# Patient Record
Sex: Male | Born: 1950 | Race: White | Hispanic: No | Marital: Married | Smoking: Never smoker
Health system: Southern US, Community
[De-identification: ages and names within clinical notes are randomized; demographics above are authoritative.]

## PROBLEM LIST (undated history)

## (undated) DIAGNOSIS — E785 Hyperlipidemia, unspecified: Secondary | ICD-10-CM

## (undated) DIAGNOSIS — I4891 Unspecified atrial fibrillation: Secondary | ICD-10-CM

## (undated) DIAGNOSIS — I10 Essential (primary) hypertension: Secondary | ICD-10-CM

## (undated) HISTORY — DX: Unspecified atrial fibrillation: I48.91

## (undated) HISTORY — DX: Hyperlipidemia, unspecified: E78.5

## (undated) HISTORY — PX: OTHER SURGICAL HISTORY: SHX169

## (undated) HISTORY — DX: Essential (primary) hypertension: I10

---

## 2001-10-03 ENCOUNTER — Observation Stay (HOSPITAL_COMMUNITY): Admission: RE | Admit: 2001-10-03 | Discharge: 2001-10-04 | Payer: Self-pay | Admitting: Specialist

## 2003-05-10 HISTORY — PX: TOTAL KNEE ARTHROPLASTY: SHX125

## 2003-09-23 ENCOUNTER — Ambulatory Visit (HOSPITAL_COMMUNITY): Admission: RE | Admit: 2003-09-23 | Discharge: 2003-09-23 | Payer: Self-pay | Admitting: Specialist

## 2004-01-02 ENCOUNTER — Inpatient Hospital Stay (HOSPITAL_COMMUNITY): Admission: RE | Admit: 2004-01-02 | Discharge: 2004-01-05 | Payer: Self-pay | Admitting: Specialist

## 2006-06-14 ENCOUNTER — Ambulatory Visit: Payer: Self-pay | Admitting: Internal Medicine

## 2006-06-14 LAB — CONVERTED CEMR LAB
Basophils Absolute: 0 10*3/uL (ref 0.0–0.1)
Calcium: 9.5 mg/dL (ref 8.4–10.5)
Chloride: 108 meq/L (ref 96–112)
Eosinophils Absolute: 0.2 10*3/uL (ref 0.0–0.6)
Eosinophils Relative: 2.7 % (ref 0.0–5.0)
GFR calc Af Amer: 63 mL/min
GFR calc non Af Amer: 52 mL/min
Glucose, Bld: 99 mg/dL (ref 70–99)
Lymphocytes Relative: 21.6 % (ref 12.0–46.0)
MCHC: 34.6 g/dL (ref 30.0–36.0)
MCV: 92 fL (ref 78.0–100.0)
Monocytes Relative: 9.1 % (ref 3.0–11.0)
Neutro Abs: 5.7 10*3/uL (ref 1.4–7.7)
Platelets: 199 10*3/uL (ref 150–400)
RBC: 4.72 M/uL (ref 4.22–5.81)
WBC: 8.6 10*3/uL (ref 4.5–10.5)
aPTT: 28.8 s (ref 26.5–36.5)

## 2006-06-21 ENCOUNTER — Ambulatory Visit: Payer: Self-pay | Admitting: Internal Medicine

## 2006-06-21 ENCOUNTER — Inpatient Hospital Stay (HOSPITAL_BASED_OUTPATIENT_CLINIC_OR_DEPARTMENT_OTHER): Admission: RE | Admit: 2006-06-21 | Discharge: 2006-06-21 | Payer: Self-pay | Admitting: Internal Medicine

## 2006-07-03 ENCOUNTER — Ambulatory Visit: Payer: Self-pay | Admitting: Cardiology

## 2007-04-11 ENCOUNTER — Encounter (INDEPENDENT_AMBULATORY_CARE_PROVIDER_SITE_OTHER): Payer: Self-pay | Admitting: *Deleted

## 2008-02-29 ENCOUNTER — Ambulatory Visit (HOSPITAL_BASED_OUTPATIENT_CLINIC_OR_DEPARTMENT_OTHER): Admission: RE | Admit: 2008-02-29 | Discharge: 2008-02-29 | Payer: Self-pay | Admitting: Orthopedic Surgery

## 2008-06-05 ENCOUNTER — Ambulatory Visit (HOSPITAL_BASED_OUTPATIENT_CLINIC_OR_DEPARTMENT_OTHER): Admission: RE | Admit: 2008-06-05 | Discharge: 2008-06-05 | Payer: Self-pay | Admitting: Orthopedic Surgery

## 2009-04-07 ENCOUNTER — Ambulatory Visit: Payer: Self-pay | Admitting: Family Medicine

## 2009-04-07 DIAGNOSIS — I10 Essential (primary) hypertension: Secondary | ICD-10-CM

## 2009-04-07 DIAGNOSIS — R21 Rash and other nonspecific skin eruption: Secondary | ICD-10-CM

## 2009-04-07 DIAGNOSIS — E785 Hyperlipidemia, unspecified: Secondary | ICD-10-CM

## 2009-04-21 ENCOUNTER — Ambulatory Visit: Payer: Self-pay | Admitting: Family Medicine

## 2009-04-21 ENCOUNTER — Encounter (INDEPENDENT_AMBULATORY_CARE_PROVIDER_SITE_OTHER): Payer: Self-pay | Admitting: *Deleted

## 2009-04-21 LAB — CONVERTED CEMR LAB
Bilirubin Urine: NEGATIVE
Nitrite: NEGATIVE
Specific Gravity, Urine: 1.02
Urobilinogen, UA: 0.2

## 2009-04-22 LAB — CONVERTED CEMR LAB
ALT: 19 units/L (ref 0–53)
AST: 24 units/L (ref 0–37)
Albumin: 4.2 g/dL (ref 3.5–5.2)
Alkaline Phosphatase: 24 units/L — ABNORMAL LOW (ref 39–117)
BUN: 29 mg/dL — ABNORMAL HIGH (ref 6–23)
Basophils Absolute: 0 10*3/uL (ref 0.0–0.1)
Basophils Relative: 0.4 % (ref 0.0–3.0)
Bilirubin, Direct: 0.2 mg/dL (ref 0.0–0.3)
CO2: 27 meq/L (ref 19–32)
Calcium: 9.1 mg/dL (ref 8.4–10.5)
Chloride: 106 meq/L (ref 96–112)
Cholesterol: 179 mg/dL (ref 0–200)
Creatinine, Ser: 1.8 mg/dL — ABNORMAL HIGH (ref 0.4–1.5)
Direct LDL: 106.5 mg/dL
Eosinophils Absolute: 0.2 10*3/uL (ref 0.0–0.7)
Eosinophils Relative: 2.8 % (ref 0.0–5.0)
GFR calc non Af Amer: 41.43 mL/min (ref >60)
Glucose, Bld: 94 mg/dL (ref 70–99)
HCT: 40.8 % (ref 39.0–52.0)
HDL: 45.1 mg/dL (ref >39.00)
Hemoglobin: 13.8 g/dL (ref 13.0–17.0)
Lymphocytes Relative: 24.6 % (ref 12.0–46.0)
Lymphs Abs: 2 10*3/uL (ref 0.7–4.0)
MCHC: 33.8 g/dL (ref 30.0–36.0)
MCV: 96.2 fL (ref 78.0–100.0)
Monocytes Absolute: 0.8 10*3/uL (ref 0.1–1.0)
Monocytes Relative: 10.5 % (ref 3.0–12.0)
Neutro Abs: 5 10*3/uL (ref 1.4–7.7)
Neutrophils Relative %: 61.7 % (ref 43.0–77.0)
PSA: 0.68 ng/mL (ref 0.10–4.00)
Platelets: 153 10*3/uL (ref 150.0–400.0)
Potassium: 4.1 meq/L (ref 3.5–5.1)
RBC: 4.24 M/uL (ref 4.22–5.81)
RDW: 12.7 % (ref 11.5–14.6)
Sodium: 141 meq/L (ref 135–145)
TSH: 1.23 microintl units/mL (ref 0.35–5.50)
Total Bilirubin: 1.1 mg/dL (ref 0.3–1.2)
Total CHOL/HDL Ratio: 4
Total Protein: 6.7 g/dL (ref 6.0–8.3)
Triglycerides: 235 mg/dL — ABNORMAL HIGH (ref 0.0–149.0)
VLDL: 47 mg/dL — ABNORMAL HIGH (ref 0.0–40.0)
WBC: 8 10*3/uL (ref 4.5–10.5)

## 2009-05-15 ENCOUNTER — Telehealth: Payer: Self-pay | Admitting: Family Medicine

## 2009-05-18 ENCOUNTER — Telehealth: Payer: Self-pay | Admitting: Family Medicine

## 2009-12-14 ENCOUNTER — Encounter: Payer: Self-pay | Admitting: Family Medicine

## 2010-02-24 ENCOUNTER — Ambulatory Visit: Payer: Self-pay | Admitting: Family Medicine

## 2010-02-25 LAB — CONVERTED CEMR LAB
BUN: 29 mg/dL — ABNORMAL HIGH (ref 6–23)
Bilirubin, Direct: 0.2 mg/dL (ref 0.0–0.3)
Chloride: 109 meq/L (ref 96–112)
Cholesterol: 177 mg/dL (ref 0–200)
LDL Cholesterol: 95 mg/dL (ref 0–99)
Potassium: 4.3 meq/L (ref 3.5–5.1)
Total Protein: 6.8 g/dL (ref 6.0–8.3)
VLDL: 37.6 mg/dL (ref 0.0–40.0)

## 2010-03-02 ENCOUNTER — Encounter: Admission: RE | Admit: 2010-03-02 | Discharge: 2010-03-02 | Payer: Self-pay | Admitting: General Surgery

## 2010-03-04 ENCOUNTER — Telehealth: Payer: Self-pay | Admitting: Family Medicine

## 2010-03-04 ENCOUNTER — Ambulatory Visit: Payer: Self-pay | Admitting: Family Medicine

## 2010-03-04 DIAGNOSIS — I4891 Unspecified atrial fibrillation: Secondary | ICD-10-CM

## 2010-03-05 ENCOUNTER — Encounter: Payer: Self-pay | Admitting: Family Medicine

## 2010-03-24 ENCOUNTER — Encounter: Payer: Self-pay | Admitting: Family Medicine

## 2010-06-10 NOTE — Progress Notes (Signed)
Summary: Hernia surg/A-Fib  Phone Note From Other Clinic   Summary of Call: Call from anesthesiologist.  pt with elective hernia repair.  Preop EKG unremarkable.  Following surgery A fib with rate 90s to 100.  Pt stable with no c/o and no chest pain.  We will work in to be seen this afternoon or tomorrow.  Call [phone] (recovery) to schedule time with pt. Initial call taken by: Evelena Peat MD,  March 04, 2010 9:42 AM  Follow-up for Phone Call        Pt scheduled for this afternoon 3:30 Follow-up by: Sid Falcon LPN,  March 04, 2010 9:55 AM

## 2010-06-10 NOTE — Letter (Signed)
Summary: [facility]   Imported By: Maryln Gottron 04/14/2010 09:08:29  _____________________________________________________________________  External Attachment:    Type:   Image     Comment:   External Document

## 2010-06-10 NOTE — Assessment & Plan Note (Signed)
Summary: A-Fib follow-up/nn   Vital Signs:  Patient profile:   60 year old male Temp:     98.2 degrees F oral Pulse rate:   72 / minute Pulse rhythm:   regular Resp:     12 per minute BP sitting:   100 / 60  (left arm) Cuff size:   large  Vitals Entered By: Sid Falcon LPN (March 04, 2010 3:32 PM)  History of Present Illness: Patient seen as a work-in with reported atrial fibrillation this morning after he came out of surgery for elective hernia repair. I received call from anesthesiologist that he was in atrial fibrillation with rate around 90. Patient has history of previous transient atrial fibrillation with prior surgical procedure. No recent atrial fibrillation prior to this morning. Denies any chest pains or dyspnea. Recent thyroid functions normal. Remains on several antihypertensives as listed. Blood pressures have been well controlled.  No signif ETOH use.    Allergies (verified): No Known Drug Allergies  Past History:  Social History: Last updated: 04/07/2009 Occupation:  Electronics engineer Co Married Never Smoked Alcohol use-yes Regular exercise-yes  Past Medical History: Hyperlipidemia Hypertension Hx transient Atrial Fibrillation PMH reviewed for relevance, SH/Risk Factors reviewed for relevance  Review of Systems  The patient denies fever, chest pain, syncope, dyspnea on exertion, peripheral edema, prolonged cough, hemoptysis, and abdominal pain.    Physical Exam  General:  Well-developed,well-nourished,in no acute distress; alert,appropriate and cooperative throughout examination Neck:  No deformities, masses, or tenderness noted. Lungs:  Normal respiratory effort, chest expands symmetrically. Lungs are clear to auscultation, no crackles or wheezes. Heart:  Normal rate and regular rhythm. S1 and S2 normal without gallop, murmur, click, rub or other extra sounds. Extremities:  no edema. Neurologic:  alert & oriented X3 and cranial  nerves II-XII intact.   Psych:  normally interactive, good eye contact, not anxious appearing, and not depressed appearing.     Impression & Recommendations:  Problem # 1:  ATRIAL FIBRILLATION (ICD-427.31) Assessment New  now resolved. Patient has sinus rhythm with occasional PACs.  EKG strips post op reviewed and confirm a fib.  Observe for now. Has had prior holter monitor several years ago. His updated medication list for this problem includes:    Verapamil Hcl Cr 240 Mg Xr24h-cap (Verapamil hcl) ..... Once daily    Aspirin 81 Mg Tabs (Aspirin) ..... Once daily  Orders: EKG w/ Interpretation (93000)  Complete Medication List: 1)  Vytorin 10-40 Mg Tabs (Ezetimibe-simvastatin) .... Once daily 2)  Lisinopril-hydrochlorothiazide 20-12.5 Mg Tabs (Lisinopril-hydrochlorothiazide) .... Once daily 3)  Tricor 145 Mg Tabs (Fenofibrate) .... Once daily 4)  Cozaar 100 Mg Tabs (Losartan potassium) .... Once daily 5)  Verapamil Hcl Cr 240 Mg Xr24h-cap (Verapamil hcl) .... Once daily 6)  Aspirin 81 Mg Tabs (Aspirin) .... Once daily 7)  Triamcinolone Acetonide 0.1 % Crea (Triamcinolone acetonide) .... Compound: triam = 1 part cetaphil lotion = 2 parts apply to area two times a day   Orders Added: 1)  EKG w/ Interpretation [93000] 2)  Est. Patient Level IV [16109]

## 2010-06-10 NOTE — Assessment & Plan Note (Signed)
Summary: med check/cjr   Vital Signs:  Patient profile:   60 year old male Weight:      266 pounds Temp:     98.4 degrees F BP sitting:   120 / 72  (left arm) Cuff size:   large  Vitals Entered By: Sid Falcon LPN (February 24, 2010 8:46 AM)  History of Present Illness: Patient for followup multiple medical problems. History of hyperlipidemia, hypertension, and probable chronic kidney disease. Creatinine 1.8 last year and in looking back over labs 3  years ago 1.5. Current medications are reviewed. Compliant with all. No side effects. Declines flu vaccine.  Hypertension History:      He denies headache, chest pain, palpitations, dyspnea with exertion, orthopnea, PND, peripheral edema, visual symptoms, neurologic problems, syncope, and side effects from treatment.  He notes no problems with any antihypertensive medication side effects.        Positive major cardiovascular risk factors include male age 59 years old or older, hyperlipidemia, and hypertension.  Negative major cardiovascular risk factors include no history of diabetes, negative family history for ischemic heart disease, and non-tobacco-user status.        Further assessment for target organ damage reveals no history of ASHD, stroke/TIA, or peripheral vascular disease.    Lipid Management History:      Positive NCEP/ATP III risk factors include male age 59 years old or older and hypertension.  Negative NCEP/ATP III risk factors include non-diabetic, no family history for ischemic heart disease, non-tobacco-user status, no ASHD (atherosclerotic heart disease), no prior stroke/TIA, no peripheral vascular disease, and no history of aortic aneurysm.      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  Teeth in good repair. Neck:  No deformities, masses, or tenderness noted. Lungs:  Normal respiratory effort, chest expands symmetrically. Lungs are clear to auscultation, no crackles or wheezes. Heart:  Normal rate and regular rhythm. S1 and S2 normal without gallop, murmur, click, rub or other extra sounds. Extremities:  significant varicosities otherwise no edema Neurologic:  alert & oriented X3, cranial nerves II-XII intact, and gait normal.   Psych:  normally interactive, good eye contact, not anxious appearing, and not depressed appearing.     Impression & Recommendations:  Problem # 1:  HYPERTENSION (ICD-401.9)  His updated medication list for this problem includes:    Lisinopril-hydrochlorothiazide 20-12.5 Mg Tabs (Lisinopril-hydrochlorothiazide) ..... Once daily    Cozaar 100 Mg Tabs (Losartan potassium) ..... Once daily    Verapamil Hcl Cr 240 Mg Xr24h-cap (Verapamil hcl) ..... Once daily  Orders: TLB-BMP (Basic Metabolic Panel-BMET) (80048-METABOL)  Problem # 2:  HYPERLIPIDEMIA (ICD-272.4)  His updated medication list for this problem includes:    Vytorin 10-40 Mg Tabs (Ezetimibe-simvastatin) ..... Once daily    Tricor  145 Mg Tabs (Fenofibrate) ..... Once daily  Orders: TLB-Lipid Panel (80061-LIPID) TLB-Hepatic/Liver  Function Pnl (80076-HEPATIC)  Complete Medication List: 1)  Vytorin 10-40 Mg Tabs (Ezetimibe-simvastatin) .... Once daily 2)  Lisinopril-hydrochlorothiazide 20-12.5 Mg Tabs (Lisinopril-hydrochlorothiazide) .... Once daily 3)  Tricor 145 Mg Tabs (Fenofibrate) .... Once daily 4)  Cozaar 100 Mg Tabs (Losartan potassium) .... Once daily 5)  Verapamil Hcl Cr 240 Mg Xr24h-cap (Verapamil hcl) .... Once daily 6)  Aspirin 81 Mg Tabs (Aspirin) .... Once daily 7)  Triamcinolone Acetonide 0.1 % Crea (Triamcinolone acetonide) .... Compound: triam = 1 part cetaphil lotion = 2 parts apply to area two times a day  Other Orders: Tdap => 58yrs IM (16109) Admin 1st Vaccine (60454)  Hypertension Assessment/Plan:      The patient's hypertensive risk group is category B: At least one risk factor (excluding diabetes) with no target organ damage.  Today's blood pressure is 120/72.    Lipid Assessment/Plan:      Based on NCEP/ATP III, the patient's risk factor category is "2 or more risk factors and a calculated 10 year CAD risk of > 20%".  The patient's lipid goals are as follows: Total cholesterol goal is 200; LDL cholesterol goal is 130; HDL cholesterol goal is 40; Triglyceride goal is 150.    Patient Instructions: 1)  Please schedule a follow-up appointment in 6 months .  Prescriptions: VERAPAMIL HCL CR 240 MG XR24H-CAP (VERAPAMIL HCL) once daily  #90 x 3   Entered and Authorized by:   Evelena Peat MD   Signed by:   Evelena Peat MD on 02/24/2010   Method used:   Electronically to        [facility]. [phone]* (retail)       [address]       [place], [place]  [address]       Ph: [phone]       Fax: [phone]   RxID:   [id number] COZAAR 100 MG TABS (LOSARTAN POTASSIUM) once daily  #90 Tablet x 3   Entered and Authorized by:   Evelena Peat MD   Signed by:   Evelena Peat MD on 02/24/2010   Method used:   Electronically to        [facility]. [phone]* (retail)       [address]       [place], [place]  [address]       Ph: [phone]       Fax: [phone]   RxID:   [id number] TRICOR 145 MG TABS (FENOFIBRATE) once daily  #90 x 3   Entered and Authorized by:   Evelena Peat MD   Signed by:   Evelena Peat MD on 02/24/2010   Method used:   Electronically to        [facility]. [phone]* (retail)       [address]       [place], [place]  [address]       Ph: [phone]       Fax: [phone]   RxID:   [id number] LISINOPRIL-HYDROCHLOROTHIAZIDE 20-12.5 MG TABS (LISINOPRIL-HYDROCHLOROTHIAZIDE) once daily  #90 Tablet x 3   Entered and Authorized by:   Evelena Peat MD   Signed by:   Evelena Peat MD on 02/24/2010   Method used:   Electronically to  [facility]. [phone]* (retail)       [address]       [place], [place]  [address]       Ph: [phone]       Fax: [phone]   RxID:   [id number] VYTORIN 10-40 MG TABS (EZETIMIBE-SIMVASTATIN) once daily  #90 x 3   Entered and Authorized by:   Evelena Peat MD   Signed by:   Evelena Peat MD on 02/24/2010   Method used:   Electronically to        [facility]. [phone]* (retail)       [address]       [place], [place]  [address]       Ph: [phone]       Fax: [phone]   RxID:   [id number]    Orders Added: 1)  TLB-Lipid Panel [80061-LIPID] 2)  TLB-Hepatic/Liver Function Pnl [80076-HEPATIC] 3)  TLB-BMP (Basic Metabolic Panel-BMET) [80048-METABOL] 4)  Est. Patient Level IV [55732] 5)  Tdap => 20yrs IM [90715] 6)  Admin 1st Vaccine [20254]   Immunizations Administered:  Tetanus Vaccine:    Vaccine Type: Tdap    Site: left deltoid    Mfr: [employer] P[employer]ur    Dose: 0.5 ml    Route: IM    Given by: Sid Falcon LPN    Exp. Date: 02/26/2012    Lot #: [id number]     VIS given: 03/26/08 version given February 24, 2010.   Immunizations Administered:  Tetanus Vaccine:    Vaccine Type: Tdap    Site: left deltoid    Mfr: [employer] P[employer]ur    Dose: 0.5 ml    Route: IM    Given by: Sid Falcon LPN    Exp. Date: 02/26/2012    Lot #: [id number]    VIS given: 03/26/08 version given February 24, 2010.  Appended Document: Orders Update    Clinical Lists Changes  Orders: Added new Service order of Specimen Handling (16073) - Signed

## 2010-06-10 NOTE — Letter (Signed)
Summary: [facility]   Imported By: Maryln Gottron 12/31/2009 15:48:45  _____________________________________________________________________  External Attachment:    Type:   Image     Comment:   External Document

## 2010-06-10 NOTE — Progress Notes (Signed)
Summary: refill Verapamil X 1 year  Phone Note Refill Request Message from:  Fax from Pharmacy  Refills Requested: Medication #1:  VERAPAMIL HCL CR 240 MG XR24H-CAP once daily   Last Refilled: 04/15/2009 [phone]---[phone]   Fax---[phone]  Initial call taken by: Warnell Forester,  May 18, 2009 12:37 PM    Prescriptions: VERAPAMIL HCL CR 240 MG XR24H-CAP (VERAPAMIL HCL) once daily  #90 x 3   Entered by:   Sid Falcon LPN   Authorized by:   Evelena Peat MD   Signed by:   Sid Falcon LPN on 40/98/1191   Method used:   Electronically to        [facility]. [phone]* (retail)       [address]       [place], [place]  [address]       Ph: [phone]       Fax: [phone]   RxID:   [id number]

## 2010-06-10 NOTE — Progress Notes (Signed)
Summary: refill Vytorin, Cozaar, Tricor, Lisinopril X 6 months  Phone Note Refill Request Message from:  Fax from Pharmacy  Refills Requested: Medication #1:  VYTORIN 10-40 MG TABS once daily   Last Refilled: 04/15/2009  Medication #2:  COZAAR 100 MG TABS once daily   Brand Name Necessary? No   Last Refilled: 04/15/2009  Medication #3:  TRICOR 145 MG TABS once daily   Last Refilled: 04/15/2009  Medication #4:  LISINOPRIL-HYDROCHLOROTHIAZIDE 20-12.5 MG TABS once daily   Last Refilled: 04/15/2009 [facility] [phone]     fax----[phone]  Initial call taken by: Warnell Forester,  May 15, 2009 4:07 PM    Prescriptions: TRICOR 145 MG TABS (FENOFIBRATE) once daily  #90 x 2   Entered by:   Sid Falcon LPN   Authorized by:   Evelena Peat MD   Signed by:   Sid Falcon LPN on 74/25/9563   Method used:   Electronically to        [facility]. [phone]* (retail)       [address]       [place], [place]  [address]       Ph: [phone]       Fax: [phone]   RxID:   [id number] COZAAR 100 MG TABS (LOSARTAN POTASSIUM) once daily  #90 x 2   Entered by:   Sid Falcon LPN   Authorized by:   Evelena Peat MD   Signed by:   Sid Falcon LPN on 76/28/3151   Method used:   Electronically to        [facility]. [phone]* (retail)       [address]       [place], [place]  [address]       Ph: [phone]       Fax: [phone]   RxID:   [id number] LISINOPRIL-HYDROCHLOROTHIAZIDE 20-12.5 MG TABS (LISINOPRIL-HYDROCHLOROTHIAZIDE) once daily  #90 x 2   Entered by:   Sid Falcon LPN   Authorized by:   Evelena Peat MD   Signed by:   Sid Falcon LPN on 01/75/1025   Method used:   Electronically to        [facility]. [phone]* (retail)       [address]       [place], [place]  [address]       Ph: [phone]     Fax: [phone]   RxID:   [id number] VYTORIN 10-40 MG TABS (EZETIMIBE-SIMVASTATIN) once daily  #90 x 2   Entered by:   Sid Falcon LPN   Authorized by:   Evelena Peat MD   Signed by:   Sid Falcon LPN on 82/50/5397   Method used:   Electronically to        [facility]. [phone]* (retail)       [address]       [place], [place]  [address]       Ph: [phone]       Fax: [phone]   RxID:   [id number]

## 2010-08-12 ENCOUNTER — Telehealth: Payer: Self-pay | Admitting: *Deleted

## 2010-08-12 NOTE — Telephone Encounter (Signed)
EMR records show Lisinopril HCTZ 20-12.5 mg daily and Cozaar 100 mg daily

## 2010-08-12 NOTE — Telephone Encounter (Signed)
Spoke with pharmacist, because meds are simular they just wanted the Doctor to approve both meds be refilled

## 2010-08-12 NOTE — Telephone Encounter (Signed)
[facility] pharmacy is questioning whether pt should be on Losartan and Lisinopril Hctz? Call [facility].

## 2010-08-16 NOTE — Telephone Encounter (Signed)
Message left on Vm at pharmacy OK to take both meds

## 2010-08-16 NOTE — Telephone Encounter (Signed)
He was started on this combination of meds by another physician several years ago and is doing well so we have elected not to change his regimen.

## 2010-08-23 LAB — BASIC METABOLIC PANEL
CO2: 25 mEq/L (ref 19–32)
Chloride: 107 mEq/L (ref 96–112)
GFR calc Af Amer: >60 mL/min (ref >60)
Sodium: 138 mEq/L (ref 135–145)

## 2010-09-21 NOTE — Op Note (Signed)
Joseph [name], Joseph [name]            ACCOUNT NO.:  [id number]   MEDICAL RECORD NO.:  [id number]          PATIENT TYPE:  AMB   LOCATION:  [facility]SC                          FACILITY:  [facility]   PHYSICIAN:  Joseph [name], M.D. DATE OF BIRTH:  12-Oct-1956   DATE OF PROCEDURE:  06/05/2008  DATE OF DISCHARGE:                               OPERATIVE REPORT   PREOPERATIVE DIAGNOSES:  Chronic stiff swan-neck deformity, left long  finger, status post mallet fracture/tendon injury, left long finger,  sustained on October 31, 2007, with failed closed treatment of mallet/stiff  swan-neck followed by failed attempt at tenodermodesis performed on  February 29, 2008, with Kirschner wire fixation.   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   OPERATION:  1. Arthrodesis of left long finger distal interphalangeal joint with      autograft and placement of a 30-mm Acutrak 2 mini screw maintaining      a 5-degree flexed posture of the distal interphalangeal joint.  2. Proximal interphalangeal capsulectomy with release of lateral bands      from central slip regaining 120 degrees of passive range of motion      of the PIP joint after tenolysis capsulectomy and mobilization of      lateral bands.   OPERATIONS:  Joseph Fitch. Sypher, MD   ASSISTANT:  Joseph Reeks Dasnoit, PA-C   ANESTHESIA:  General by LMA.   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  At that time, he sustained a mallet-type injury and did  not seek immediate medical attention.   He presented for his initial hand surgery  evaluation on November 27, 2007,  and at that time was noted to have a stiff swan-neck tendinous mallet  finger with a significant extensor lag and mild stiffness.   We initially treated him with closed treatment and discovered him to be  fundamentally noncompliant, ultimately by October 2009 realizing that  his injury progressed to a stiff swan-neck with a 50-degree extensor lag  across the DIP joint and a 10-degree hyperextension of his PIP joint and  further flexion to 50 degrees despite home therapy.   Given this predicament,we attempted a tenodermatosis of the DIP joint  utilizing a 0.062-inch Kirschner wire, fixation of the DIP joint in  neutral.  Despite 7 weeks of immobilization and therapy/home exercises  to mobilize the PIP joint, we never regained more than 75 degrees of  consistent flexion at the PIP joint.   Joseph [name] returned in December 2009 and was removed from his Hexalite  splint.  Over Christmas, he discontinued the splint and returned with a  40-degree extensor lag on May 12, 2008.   At this point, it was evident that we were not going to be successful  short of  DIP arthrodesis.  I recommended proceeding with DIP arthrodesis  at this time utilizing internal fixation with an Acutrak screw and given  our inability to mobilize the PIP joint, capsulectomy and mobilization  of the lateral bands was likely going to be required at the PIP joint.   After informed consent, Joseph [name] is brought to the operating room at  this time.   Preoperatively, he was assessed by Joseph [name] and general anesthesia by  LMA technique was recommended.   PROCEDURE:  Joseph [name] was brought to the operating room and  placed in the supine position upon the operating table.   Following the induction of general anesthesia by LMA technique, the left  arm was prepped with Betadine soap and solution, sterilely draped.  Ancef 1 g was administered as an IV prophylactic antibiotic.    The entire left upper extremity and forequarter were prepped with  Betadine soap and solution and sterilely draped.  Following  exsanguination of the left arm with Esmarch bandage, arterial tourniquet  was inflated to 220 mmHg.  The procedure commenced with excision of the  prior surgical scar for tenodermodesis.  A lazy-S incision was fashioned  exposing the DIP joint and extensor mechanism.  The extensor repair had  failed and a large pseudo-tendon was noted.  The pseudo-tendon was  resected followed by immobilization of the collateral ligaments and  opening of the DIP joint shotgun style.  A cup and cone type arthrodesis  was performed a power bur to sculpt the distal phalangeal base and  rongeur and sculpture of the middle phalanx.  A 0.035-inch Kirschner  wire was placed through the intermaxillary canal of the distal phalanx  with retrograde technique followed by placement of the joint in 5  degrees flexion, 3 degrees of supination, and neutral rotation.  The  Kirschner wire was driven through the middle phalanx followed by C-arm  confirmation of satisfactory position.  The mini Acutrak screws were not  of adequate length to accomplish an arthrodesis due to Joseph [name]  having a very large hand.  We elected to proceed with a mini Acutrak  screw measured 30 mm in length.  The screws were templated prior to  surgery.   With the power, the Acutrak 2 reamer was used to a depth of 30 mm  followed by placement of an Acutrak screw controlling with C-arm  fluoroscopic imaging.  A very satisfactory compression of the  arthrodesis was achieved with satisfactory position of the joint at 5  degrees of flexion and about 3 degrees of supination, neutral rotation,  and angulation in the radial ulnar direction.   The wound at the fingertip was irrigated and repaired with simple suture  of 5-0 nylon.  The dorsal wound was repaired with full-thickness sutures  of 5-0 nylon mattress style.   Attention was then directed to the PIP  joint.  PIP exam under anesthesia revealed passive motion 0-70 degrees  with hyperextension passively of 10 degrees.   Two incisions were fashioned, one radial and one ulnar at the junction  of the central slip and lateral band.  After careful clearing of  adhesions with the skin, the interval between the radial lateral band  and central slip and the ulnar lateral band and central slip were  released followed by dorsal capsulectomy and clearing of adhesions  between the collateral ligaments and the proximal phalangeal head.  Passive range of motion of the PIP joint was immediately increased to  120 degrees.  Remobilizing the lateral bands and capsulectomy appeared  to be the ingredients that allowed recovery of full passive flexion.   These wounds were then repaired with intradermal 3-0 Prolene.  Steri-  Strips were applied followed by release of tourniquet.  Total tourniquet  time was 61 minutes.  The wounds were dressed with sterile gauze,  sterile Kerlix, and AlumaFoam splint to protect the DIP joint and a  Coban dressing.  There were no apparent complications.   Mr. Griep will be discharged with prescription for Dilaudid 2 mg 1-2  tablets q.4-6 hours p.r.n. pain, 30 tablets without refill and also  Motrin 600 mg 1 p.o. q.6 h. p.r.n. pain, 30 tablets with 1 refill and  Keflex 500 mg 1 p.o. q.8 h. x4 days as a prophylactic antibiotic.      Joseph Fitch [name], M.D.  Electronically Signed     RVS/MEDQ  D:  06/05/2008  T:  06/05/2008  Job:  [id number]   cc:   Evelena Peat, M.D.

## 2010-09-21 NOTE — Op Note (Signed)
Joseph [name], Joseph [name]            ACCOUNT NO.:  [id number]   MEDICAL RECORD NO.:  [id number]          PATIENT TYPE:  AMB   LOCATION:  DSC                          FACILITY:  [facility]   PHYSICIAN:  Katy Fitch. Sypher, M.D. 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   OPERATION:  1. Examination of left long finger under digital block anesthesia.  2. A 0.062-inch Kirschner wire fixation of left long finger DIP joint      at neutral.  3. Dorsal tenodermodesis to correct chronic extensor lag at DIP joint.   OPERATING SURGEON:  Katy Fitch. Sypher, MD   ASSISTANT:  Marveen Reeks Dasnoit, PAC   ANESTHESIA:  Lidocaine 2% digital block at metacarpal head level without  supplemental sedation.   INDICATIONS:  Eugenio Hoes. 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   He presented late for evaluation of his finger and was noted to have a  40 or more degree extensor lag at DIP joint.   He was advised to use a stack splint.  Trey Paula was not particularly  compliant with our recommendations and was lost to follow up for a  period of time.  He subsequently presented more than 2 months following  his injury with a stiff  swan-neck deformity with a 70-degree extensor  lag at the DIP joint and about 25 degrees hyperextension of the PIP  joint.  He had marked intrinsic tightness and signs of extrinsic  extensor recession.   Given this predicament, we embarked on a program of serial casting and  relieve his flexion contracture to neutral and even a few degrees of  hyperextension at the DIP joint.  He was able to improve his motion at  the PIP joint to 70 degrees of active flexion.   Due to issues with compliance as well as fatigued with the splinting  concept, I recommended proceeding with a Kirschner wire fixation of the  DIP joint coupled with splinting and tenodermodesis to try to help  correct the pseudotendon extensor imbalance at the DIP joint.   We had discussed possible intrinsic release and capsulectomy at the PIP  joint; however, I believe with a diligent therapy program, we will be  able to correct the PIP imbalance at this time.   Mr. Sobecki is brought to the operating room at this time anticipating  fixation of the DIP joint, tenodermodesis, and  stretching of his PIP  capsule.   PROCEDURE:  Kalyb Pemble is brought to the operating room and placed in  supine position on operating table.   Preoperatively, he was advised that we will perform this under straight  local anesthesia without sedation.  He was brought to room one of the  [facility] placed in supine position on operating table and  under Dr. Stark Jock direct supervision, 2% lidocaine block placed at  metacarpal head level.   The left arm was then prepped with Betadine soap and solution, sterilely  draped.  The finger was manipulated with the aid of C-arm fluoroscope.  Examination under anesthesia revealed hyperextension of the DIP joint of  5 degrees and passive DIP flexion to 70 degrees.  A 0.062-inch Kirschner  wire was then placed through the distal phalanx across the bottom of the  middle phalanx in an effort to  block the DIP joint in extension.   Given past issues with compliance, I was uncomfortable burying  the  Kirschner wire within the middle phalanx.  This construct will allow [place]  the latitude to remove the Kirschner wire either proximally or distally  should there be any compliance issues and a pin breakage.  This will  also preserve the condyles of the middle phalanx.   C-arm fluoroscopic images were obtained documenting satisfactory  placement of the Kirschner wire.   An elliptical wedge of skin, subcutaneous tissue, and pseudotendon was  resected on the dorsal aspect of the DIP joint and full-thickness 5-0  nylon sutures placed gathering the proximal and distal margins of the  wound to create a tenodermodesis.   This was subsequently dressed with Xeroflo, sterile gauze, and Coban.   The PIP joint was then gently stretched over the course of 5 minutes  increasing range of motion from flexion of 70 degrees to flexion of 95  degrees.  This help relieve some of the intrinsic tightness.   A final dressing with an Alumafoam splint was placed to protect the pin  construct while the fingers anesthetized.   Mr. Hutsell tolerated the surgery and anesthesia well.  I will see him  back in my office for followup in approximately 1 week.  His pin was  bent and buried beneath the surface of his finger pulp.      Katy Fitch Sypher, M.D.  Electronically Signed     RVS/MEDQ  D:  02/29/2008  T:  02/29/2008  Job:  [id number]

## 2010-09-24 NOTE — Assessment & Plan Note (Signed)
[facility]                            CARDIOLOGY OFFICE NOTE   RIP, HAWES                   MRN:          [id number]  DATE:07/03/2006                            DOB:          13-Mar-1951    Primary care physician Dr. Evelena Peat, primary cardiologist Dr.  Arvilla Meres.   PATIENT PROFILE:  A 60 year old Caucasian male with history of chest  pain and dyspnea who presents for post cath follow up.   PROBLEM:  1. Chest pain and dyspnea.      a.     On June 21, 2006, right and left heart cardiac       catheterization, RA 7, PA 34/11, a mean of 23, PCWP of 11, cardiac       output 4.9 liters per minute, cardiac index 2.1 liters per minute       per meter squared, PVR 2.4 Wood units.  Normal coronary arteries,       ejection fraction of 65% without regional wall motion       abnormalities.  2. Hypertension.  3. Obesity.  4. History of palpitations and atrial tachycardia, previously seen by      Dr. Graciela Husbands.  5. Hyperlipidemia/hypertriglyceridemia.  6. Abnormal chest x-ray.      a.     June 14, 2006 chest x-ray showing a sclerotic expansile       lesion of the right anterolateral 8th rib simulating a right lower       lobe mass, present or stable since August of 2005.   HISTORY OF PRESENT ILLNESS:  A 60 year old Caucasian male with the above  problem list who was recently seen by Dr. Gala Romney in clinic on  June 14, 2006 for chest pain and dyspnea.  He subsequently underwent  right and left heart cardiac catheterization on February 13, revealing  normal coronary arteries and normal right heart pressures.  Since his  catheterization, he has done well without any recurrent chest pain or  shortness of breath.  His groin has healed well with the exception of a  small knot at the sheath site.  He has otherwise had no limitations in  his activities.  We discussed his abnormal chest x-ray and he says he  has been told this many,  many times over the years.  He has never had a  chest CT, but has been told in the past that it is a stable finding and  he is not interested in a chest CT at this time.   HOME MEDICATIONS:  1. Aspirin 81 mg daily.  2. Vytorin 10/40 mg daily.  3. Lisinopril 20 mg daily.  4. Tricor 145 mg daily.  5. Cozaar 100 mg daily.  6. Verapamil ER 240 mg daily.  7. Carvedilol 12.5 mg b.i.d.   PHYSICAL EXAMINATION:  Blood pressure 132/78, heart rate 74,  respirations 16, his weight is 270 pounds.  A pleasant white male in no acute distress.  Awake, alert and oriented  x3.  NECK:  No bruits or JVD.  LUNGS:  Respirations regular and unlabored, clear to auscultation.  CARDIAC:  Regular S1,  S2, no S3, S4 or murmurs.  ABDOMEN:  Round, soft, nontender, nondistended, bowel sounds present x4.  EXTREMITIES:  Warm, dry, pink.  No clubbing, cyanosis or edema.  Dorsalis pedis, posterior tibial pulses 2+ and equal bilaterally.  The  right groin site, which was used for cath, is clear of bleeding, bruits  or hematoma.   ACCESSORY CLINICAL FINDINGS:  EKG shows a sinus rhythm without any acute  ST-T changes.   ASSESSMENT/PLAN:  1. Chest pain/dyspnea.  Catheterization revealed normal coronary      arteries, normal left ventricular function, and normal right heart      pressures.  He remains on aspirin, statin, beta blocker, ACE      inhibitor and ARB therapy, primarily for hypertension.  2. Hypertension.  The patient notes that his pressures are generally      in the 110s-130s at home.  He is 132 by manual cuff today.  Will      continue his current regimen.  3. Hyperlipidemia.  He is on Vytorin at home and is tolerating this.  4. History of palpitations.  He takes Verapamil and denies any      palpitations today.  5. Obesity.  Recommended exercise.   DISPOSITION:  Follow up with Dr. Gala Romney in 6 months or sooner as  necessary.      Nicolasa Ducking, ANP  Electronically Signed      Everardo Beals. Juanda Chance, MD, [facility]  Electronically Signed   CB/MedQ  DD: 07/03/2006  DT: 07/03/2006  Job #: [id number]

## 2010-09-24 NOTE — Op Note (Signed)
NAME:  Joseph [name], Joseph [name]                      ACCOUNT NO.:  [id number]   MEDICAL RECORD NO.:  [id number]                   PATIENT TYPE:  INP   LOCATION:  0465                                 FACILITY[facility]  [facility]   PHYSICIAN:  Jene Every, M.D.                 DATE OF BIRTH:  [birth year]   DATE OF PROCEDURE:  01/02/2004  DATE OF DISCHARGE:                                 OPERATIVE REPORT   PREOPERATIVE DIAGNOSIS:  Degenerative joint disease, right knee.   POSTOPERATIVE DIAGNOSIS:  Degenerative joint disease, right knee.   PROCEDURE PERFORMED:  Right total knee arthroplasty.   ANESTHESIA:  General.   ASSISTANT:  Roma Schanz, P.A.   COMPONENTS UTILIZED:  Osteonics Scorpio 11 femur, 13 tibia, 12 mm  polyethylene insert, 28 patella.   BRIEF HISTORY/INDICATIONS:  This is a 60 year old with end-stage  osteoarthritis of the right knee refractory to conservative treatment with  radiographs demonstrating tricompartmental osteoarthrosis.  Operative  intervention is indicated for replacement of degenerative joint.  The risks  and benefits were discussed, including bleeding, infection, damage to  neurovascular structures, suboptimal range of motion, arthrofibrosis,  component failure, need for revision, DVT, PE, etc.   TECHNIQUE:  The patient in the supine position after induction of adequate  general anesthesia, 1 gm Kefzol, the right lower extremity was prepped,  draped, and exsanguinated in the usual sterile fashion.  A thigh tourniquet  was inflated to 300 mmHg.  A midline incision was made in the skin.  Subcutaneous tissue was dissected by electrocautery to achieve hemostasis.  Laps were kept to a minimum.  A parapatellar arthrotomy was performed.  The  patella was everted, and the knee was flexed.  Clear synovial fluid was  evacuated.  Severe tricompartmental osteoarthrosis was noted.  Osteophytes  were removed with a rongeur.  A step drill was utilized and entered the  femoral  canal and was irrigated and evacuated.  An intramedullary guide 5  degree, left, placed and secured to the femur.  A 10 mm cut obtained.  Due  to the contraction and tightness of the knee, we had to resect the tibia as  an intervening step.  Removed the tibial spine with an oscillating saw,  subluxed the tibia with a McHale and removed the remnants of the menisci and  the ACL.  This plate measured 11 or a 13.  Step drill entered the tibial  canal.  This was evacuated and irrigated.  Intramedullary guide placed 6 mm  off the medial defect medial to the tibial tubercle, parallel to the tibia,  bisecting the medial malleolus with an external alignment guide.  This was  then pinned in a 5 degrees slope initially with 0 and then a 5 degree slope  was used.  The cut was performed.  Osteophytes were removed with the  rongeur.  Initially, the approach to the knee, we released the superficial  medial collateral ligament as a  soft tissue release and released the  patellofemoral ligament.  Next, we flexed the knee and continued with the  femur.  Placed a distal femoral cutting guide.  The custom jig parallel to  the transepicondylar axis.  This measured about 3 degrees.  Measured at an  11 femur.  This was pinned.  Anterior, posterior, and chamfer cuts were  formed.  No notching of the anterior femur occurred with an excellent flush  cut.  This was trialed to an 40 with a perfect fit.  Next, I used the  patellofemoral guide for a patellofemoral groove, and then the box cut was  performed after scoring with an oscillating saw, utilizing the chisel and  the punch guide, removing the boxed bone.  An ACL remnant was removed from  both its attachments.  This freed up the posterior capsule.  I then turned  back to the tibia.  Subluxed the tibia.  The base plate applied, actually,  was better fitting with the 13 than the 11.  With the femur on, the  appropriate rotation with external alignment guide, we  marked the tibia.  This base plate was then pinned, and the punch guide was utilized.  The four  punch guides were utilized.  Next, the patella was everted and clamped on  the medial aspect of the patellar axis.  A 28 was sized.  A 10 mm ream, peg  holes were reamed as well.  The patella was marked.  Next, all components  were reapplied.  A 12 fit optimally between a 10 and a 12.  We checked  posteriorly, the capsule was released.  All soft tissue was removed and  osteophytes.  These osteophytes were removed with a rongeur initially.  Next, pulsatile lavage was used to irrigate and debride the wound.  The knee  was then flexed.  A McHale retractor was utilized.  The surface dried and  evacuated.  Gelfoam placed in the distal mid tibia as a cement plug.  The  cement was mixed in the appropriate fashion, brought to the table, injected  onto the tibia.  The tibial component, #13, was then impacted, and the  appropriate versions were done and cement removed.  The femur was impacted  as well after cement was placed over the femur.  Posterior chamfers were  done, and cement removed.  I placed a 10 insert, reduced.  Axial load  applied.  A 28 patella was then cemented with a clamp.  Redundant cement  removed after curing of the cement.  Clamps were then removed.  Redundant  cement removed.  We had arranged the tibia.  A 10 seemed to be slightly lax  in the extension with varus/valgus stressing.  Removed the inserts,  subluxed, and removed the redundant cement.  Checked posteriorly and  anteriorly.  Good fit.  We trialed a 12, which was optimal.  Full flexion,  140.  Full extension.  No instability on varus and valgus stressing.  Appropriate collateral ligament tension.  Put a 12 permanent insert.  Checked it with a Engineer, site after impacting.  It was well secured.  Reduced.  Again, full range.  No instability.  The wound was copiously irrigated. Electrocautery was used to cauterize the posterior  geniculate vessels.  The  posterior Hemovac brought out through a lateral stab wound in the skin.  Placed 0.25% Marcaine with epinephrine into the joint.  Closed the patellar  arthrotomy with #1 Vicryl interrupted figure-of-eight sutures at 45 degrees  of mild  lateral retinacular release with better patellofemoral tracking.  Closed at approximately 35 degrees, a patellar arthrotomy.  A #1 Vicryl  interrupted figure-of-eight sutures.  The subcutaneous tissue was  reapproximated with 0 and 2-0 Vicryl simple interrupted sutures.  The skin  was reapproximated with staples and was dressed sterilely.  Placed supine on  the hospital bed.  The skin was closed as well with staples.  Flexed and  extended to 140 and flexion and extension to 0.  No significant tension on  the wound.  The tourniquet was released at two hours.  There was active  revascularization of the lower extremity appreciated.  A sterile dressing  applied.  Compression dressing was secured with an Ace bandage.  We trialed  initially with flexion and extension gaps, and they were equal after all the  cuts.  We used a flexion and extension block.   Patient tolerated the procedure well.  No complications.  Tourniquet time  two hours.                                               Jene Every, M.D.    Cordelia Pen  D:  01/02/2004  T:  01/02/2004  Job:  [id number]

## 2010-09-24 NOTE — H&P (Signed)
NAME:  Joseph Joseph [name], Joseph Joseph [name]                      ACCOUNT NO.:  [id number]   MEDICAL RECORD NO.:  [id number]                   PATIENT TYPE:  INP   LOCATION:  NA                                   [facility]ACILITY[facility]   PHYSICIAN:  Jene Every, M.D.                 DATE OF BIRTH:  10/09/1950   DATE OF ADMISSION:  DATE OF DISCHARGE:                                HISTORY & PHYSICAL   CHIEF COMPLAINT:  Right knee pain.   HISTORY OF PRESENT ILLNESS:  Joseph Joseph [name] is Joseph [name] 60 year old gentleman with Joseph [name]  longstanding history of right knee pain.  He has previously undergone four  arthroscopic debridements of the knee and then diagnosed with significant  degenerative changes.  The patient has been treated conservatively over the  years including cortisone injections followed with arthroscopic debridement.  He presents to our office ready to have his knee replaced. On examination,  the patient has significant medial joint line tenderness.  Patellofemoral  pain with compression with loss of range of motion at -5 to 90 degrees.  X-  rays do reveal end-stage osteoarthritis of the knee tricompartmental.  The  risks and benefits of the surgery were discussed with the patient.  He did  receive medical clearance from Dr. Arlyce [name], also underwent Doppler studies.  These were negative. Therefore, we will proceed with Joseph [name] total knee  arthroplasty.   PAST MEDICAL HISTORY:  1. Hypertension.  2. Hypercholesterolemia.   CURRENT MEDICATIONS:  1. Vitorin 10/40 mg one p.o. daily.  2. Verapamil ER 240 mg one p.o. daily.  3. Cozaar 100 mg one p.o. daily.  4. Lisinopril 20 mg one p.o. daily.  5. Aspirin 81 mg two p.o. daily.   PAST SURGICAL HISTORY:  Right knee arthroscopy x4.   SOCIAL HISTORY:  The patient is married.  He does not smoke.  He drinks  occasional alcohol.  __________  following surgery.  He lives in Joseph [name] two-story  house.  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  The patient's primary care  physician is  Dr. Arlyce [name].   FAMILY HISTORY:  Father with history of lung disease, also history of  coronary artery disease and CABG.  Mother with history of breast cancer.   REVIEW OF SYMPTOMS:  GENERAL:  The patient denies any fevers, chills, or  night sweats, no bleeding tendencies. No blurry or double vision, seizure,  headache or paralysis.  RESPIRATORY:  No shortness of breath, productive  cough or  hemoptysis.  CARDIOVASCULAR:  No chest pain, angina or orthopnea.  GU:  No dysuria, hematuria or discharge.  GI:  No nausea, vomiting,  diarrhea, constipation, melena or bloody stools.  MUSCULOSKELETAL:  Pertinent as HPI.   PHYSICAL EXAMINATION:  GENERAL APPEARANCE:  This is Joseph [name] well-developed, well-  nourished 60 year old gentleman who is very anxious in nature.  VITAL SIGNS:  Pulses 70 and irregular, respiratory rate 16, blood pressure  148/110.  HEENT:  Atraumatic and normocephalic.  Pupils are equal, round and reactive  to light.  EOM intact.  NECK:  Supple, no lymphadenopathy.  CHEST:  Clear to auscultation bilaterally.  No wheezing, rhonchi or rales.  BREASTS/GU:  Not examined, not pertinent to HPI.  CARDIOVASCULAR:  Regular rate with irregular rhythm, no murmurs, rubs, or  gallops are noted.  ABDOMEN:  Soft and nontender, nondistended with bowel sounds x4.  EXTREMITIES:  The patient does have positive fusion in the right knee with  decreased range of motion at -5 to 98 degrees.  He does have multiple  varicosities with 1+ pitting edema bilaterally.  SKIN:  No rashes or lesions are noted.   IMPRESSION:  Degenerative joint disease right knee.   PLAN:  The patient will be admitted to [facility] to undergo right  total knee arthroplasty.  Will consult Hospitalists for postoperative care.      Joseph Joseph [name], P.Joseph [name].                   Jene Every, M.D.    CS/MEDQ  D:  12/26/2003  T:  12/26/2003  Job:  [id number]

## 2010-09-24 NOTE — Cardiovascular Report (Signed)
NAMEVONTAE, COURT            ACCOUNT NO.:  [id number]   MEDICAL RECORD NO.:  [id number]          PATIENT TYPE:  OIB   LOCATION:  1961                         FACILITY:  [facility]   PHYSICIAN:  Bevelyn Buckles. Bensimhon, MDDATE OF BIRTH:  Oct 07, 1950   DATE OF PROCEDURE:  06/21/2006  DATE OF DISCHARGE:                            CARDIAC CATHETERIZATION   DATE OF PROCEDURE:  Is June 21, 2006.   PRIMARY CARE PHYSICIAN:  Evelena Peat, M.D.   PATIENT IDENTIFICATION:  Joseph [name] is a very pleasant 60 year old  male who presented with progressive dyspnea on exertion in the setting  of multiple cardiac risk factors including hypertension, hyperlipidemia  and a family history of coronary artery disease.  The risks, benefits of  stress testing versus catheterization were explained, and the decision  was made to proceed with diagnostic catheterization.  This was performed  in the outpatient catheterization lab.   PROCEDURES PERFORMED:  1. Right heart cath.  2. Left heart cath.  3. Left ventriculogram.  4. Selective coronary angiography.  5. Selective bilateral renal angiography to evaluate Joseph renovascular      hypertension.   DESCRIPTION OF PROCEDURE:  The risks and benefits of catheterization  were explained.  Consent was signed and placed on the chart.  A 4-French  arterial sheath was placed in the right femoral artery using a modified  Seldinger technique.  A JL-5, 3DRC and angled pigtail used procedural,  all catheter exchanges made over wire.  A 7-French venous sheath was  placed in the right femoral vein using a modified Seldinger technique,  and a standard Swan-Ganz catheter was used for a right heart  catheterization.  There are no apparent complications.   HEMODYNAMIC RESULTS:  Right atrial pressure mean of seven, RV pressure  35/5, PA pressure 34/11 with a mean of 23, pulmonary capillary wedge  pressure mean of 11.  There were no significant V-waves.  Aortic  pressure  is 140/91 with a mean of 111.  LV pressure 140/6 with a LVEDP  of 13.  There is no evidence aortic stenosis on pullback.  On  simultaneous LV and wedge pressures, there is no evidence of mitral  stenosis or mitral regurgitation.  Fick cardiac output 4.9 liters per  minute.  Cardiac index was 2.1 liters per minute per meter squared.  Pulmonary vascular resistance was 2.4 Woods units.   CORONARY ANGIOGRAPHY:  Left main:  Normal.   LAD was a long vessel wrapping the apex.  It gave off a large first  diagonal, a small second diagonal.  It was angiographically normal.   The left circumflex gave off a small ramus branch and a large OM-1.  It  was angiographically normal.   Right coronary artery was a moderate-sized dominant vessel, gave off an  RV branch, of moderate size acute marginal, a small-to- moderate PDA and  two small posterolaterals.  It was angiographically normal.   Left ventriculogram done in the RAO projection showed an EF of 65% with  no regional wall motion abnormalities and no mitral regurgitation.   Selective renal angiography revealed widely patent renal arteries  bilaterally.  ASSESSMENT:  1. Normal coronary arteries.  2. Normal LV function.  3. Normal filling pressures.  4. Systemic hypertension with normal renal arteries.   PLAN/DISCUSSION:  I suspect Joseph [name]'s dyspnea on exertion is  probably due to progressive deconditioning.  I have recommended a graded  exercise program.  He is to follow up with me, if he sees no improvement  in several months.      Bevelyn Buckles. Bensimhon, MD  Electronically Signed     DRB/MEDQ  D:  06/21/2006  T:  06/21/2006  Job:  [id number]   cc:   Evelena Peat, M.D.

## 2010-09-24 NOTE — Discharge Summary (Signed)
NAME:  Joseph [name], Joseph [name]                      ACCOUNT NO.:  [id number]   MEDICAL RECORD NO.:  [id number]                   PATIENT TYPE:  INP   LOCATION:  0465                                 FACILITY:  [facility]   PHYSICIAN:  Jene Every, M.D.                 DATE OF BIRTH:  July 10, 1950   DATE OF ADMISSION:  01/02/2004  DATE OF DISCHARGE:  01/05/2004                                 DISCHARGE SUMMARY   ADMISSION DIAGNOSES:  1.  Degenerative joint disease with knee.  2.  Hypertension.  3.  Hypercholesterolemia.   DISCHARGE DIAGNOSES:  Degenerative joint disease right knee status post  right total knee arthroplasty otherwise same as above.   CONSULTATIONS:  PT, OT.   PROCEDURE:  The patient was taken to the OR on January 02, 2004 to undergo  total knee arthroplasty on the right. Surgeon Jene Every, M.D., assistant  Roma Schanz, P.A.-C.  Anesthesia general, complications none.   HISTORY:  Joseph [name] is a 60 year old gentleman with a longstanding  history of right knee pain. He has previously undergone four arthroscopic  debridements of the knee which has resulted in severe degenerative changes.  The patient is being treated conservatively with multiple cortisone  injections but has noted progression of his knee symptoms.  He does have  significant medial joint line tenderness with patellofemoral pain with  compression with loss of range of motion.  X-rays do reveal end-stage  osteoarthritis of the knee which is tricompartmental. The risks and benefits  of the surgery were discussed with the patient, he did wish to proceed with  a total knee arthroplasty on the right.   PREOPERATIVE LABS:  His white blood cell count was 7.2, hemoglobin 13.8,  hematocrit 41.0.  Routine H&H's were followed throughout the hospital  course. At the time of discharge, hemoglobin was 9.5, hematocrit 27.9.  Coagulation studies done preoperatively showed PT of 11.6, INR of 0.8, PTT  of 26.   Coagulation studies were followed. At the time of discharge, PT was  17.4, INR of 1.6.  Routine chemistries obtained preoperatively showed sodium  141, potassium 4.1, glucose 98 with a normal BUN and creatinine.  These were  followed throughout the hospital course. The patient had a slight drop in  his sodium to a level of 124, however, at the time of discharge, this  resolved to a level of 136, potassium 4.2, slightly elevated glucose of 123,  BUN of 5 with a normal creatinine of 1.3. Routine liver function tests  within normal limits.  Urinalysis was negative. Blood type is A positive.   Preoperative EKG showed normal sinus rhythm.  Chest x-ray showed no active  cardiopulmonary process.  There is a probable chondroid lesion of the right  eighth rib anterolaterally.   HOSPITAL COURSE:  The patient was taken to the OR and underwent the above  stated procedure without difficulty.  He was then transferred to the PACU  and then to the orthopedic floor for continued postoperative care.  Postoperatively he was placed on a PC analgesic for pain, __________  Coumadin for DVT prophylaxis and one Hemovac drain was placed. The patient  did well postoperatively without significant complaints. He advanced well  with his physical therapy.  He did have a slight temperature postoperative  day #1, incentive spirometer was encouraged and this was monitored. The  patient did have some increased hypertension during the hospital course that  was monitored closely.  Postoperative day #3, the patient was doing very  well, H&H was 0.5, hematocrit 27.9, PT, INR 17.4, 1.6.  The patient was  advancing with minimal assistance with his therapy walking 200 feet.  At  this point, it was felt he was ready to be discharged home.  Home health  arrangements were made through case management.   DISCHARGE INSTRUCTIONS:  The patient will be discharged home with [facility]. He is to followup with Dr. Shelle Iron in  approximately 10 days  for repeat evaluation and removal of staples.   DISCHARGE MEDICATIONS:  1.  Percocet 1-2 p.o. q. 4-6 p.r.n. pain.  2.  Robaxin 1 p.o. q. 6-8 p.r.n. spasm.  3.  Coumadin dosed per pharmacy.  4.  Trinsicon 1 p.o. t.i.d.  5.  Resume all home medications.  Recommend over the counter stool softener.   ACTIVITY:  The patient should elevate his right lower extremity at least six  times a day with ice approximately 20 minutes at a time. Continue using the  knee immobilizer when out of bed or until can straight leg raise without  difficulty.  He is to keep the incision clean and dry, change the dressing  on a daily basis. Continue to use an incentive spirometer.   CONDITION ON DISCHARGE:  Stable __________ status post right total knee  arthroplasty.      CS/MEDQ  D:  01/26/2004  T:  01/27/2004  Job:  [id number]

## 2010-09-24 NOTE — Consult Note (Signed)
[place]  Patient:    Joseph [name], Joseph [name] Visit Number: [id number] MRN: [id number]          Service Type: OBV Location: 3W 0348 01 Attending Physician:  Pierce Crane Dictated by:   Nathen May, M.D., [facility] Proc. Date: 10/04/01 Admit Date:  10/03/2001 Discharge Date: 10/04/2001   CC:         Javier Docker, M.D.  Teena Irani. Arlyce Dice, M.D.   Consultation Report  REASON FOR CONSULTATION:  Thank you very much for asking me to see Joseph [name] in electrophysiological/cardiology consultation for arrhythmias in the setting of induction for knee arthroscopy today.  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 He was submitted for surgery again today and during induction had initially some PVCs and then frequent PACs and short runs of atrial tachycardia.  The patient has an antecedent history of recurrent abrupt onset tachy palpitations occurring mostly at night.  He thinks these are regular.  They mostly occur after he has stood up to go to the bathroom in the middle of the night.  They last for minutes and then abate spontaneously.  He has no exertional limitations apart from his knee.  He carries heavy things at work without any chest, neck, arm, or wrist discomfort.  He does have some shortness of breath on occasion.  He has occasional peripheral edema.  He has no orthopnea or nocturnal dyspnea and has had no syncope.  Has no prior TIAs.  He does not smoke.  He does drink alcohol, a couple of glasses of bourbon a day.  He has a history of dyslipidemia for which he does not take therapy.  He has not undergone close health care maintenance follow-up.  PAST MEDICAL HISTORY:  Notable primarily as above.  REVIEW OF SYSTEMS:  Noted on the intake sheet and is not reviewed further.  MEDICATIONS:  He was taking  no medications as an outpatient.  ALLERGIES:  No known drug allergies.  PHYSICAL EXAMINATION  GENERAL:  He is a middle-aged Caucasian male in no acute distress.  VITAL SIGNS:  Blood pressure 188/110, pulse 82.  HEENT:  No scleral icterus.  No xanthoma.  NECK:  Veins were flat.  Carotids were brisk and full bilaterally without bruits.  There is no lymphadenopathy.  CHEST:  Without kyphosis or scoliosis.  LUNGS:  Clear.  HEART:  Sounds were regular without murmurs or gallops.  ABDOMEN:  With active bowel sounds.  There is no midline pulsation or hepatomegaly.  EXTREMITIES:  Femoral pulses were 2+.  Distal pulses were intact.  NEUROLOGIC:  Grossly normal.  SKIN:  Warm and dry.  LABORATORIES:  Electrocardiogram demonstrated sinus rhythm ate 63 with intervals of 0.16/0.09/0.46 with an axis of 8 degrees.  There is T-wave flattening in the lateral precordium.  Telemetry is as noted above.  IMPRESSION: 1. Frequent premature atrial contractions and short runs of atrial tachycardia    during induction associated with significant hypertension. 2. Hypertension. 3. Obesity. 4. Dyslipidemia.  The above three consistent with a metabolic syndrome. 5. Status post knee surgery.  PLAN:  Joseph [name] has frequent PACs and short runs of atrial tachycardia that occurred in the setting of high adrenalin loads.  These likely relate to ectopic triggers in the atrium, probably the pulmonary veins and serve as a potential harbinger of atrial fibrillation.  With his antecedent  history of tachy palpitations and his concomitant history of hypertension, this is important to exclude as he would in this situation be at risk for thromboembolism.  He also meets criteria for the metabolic syndrome and weight loss and attention to his lipid status, I think is important in the context of health care maintenance.  RECOMMENDATIONS: 1. Based on the above arrange for an outpatient event recorder. 2.  Arrange for an outpatient Adenosine only Cardiolite assuming that his    enzymes are negative. 3. Will have him follow up with me in four weeks. 4. Encouraged him to lose weight. 5. Establish closer ties with Dr. ______ for primary health care maintenance.  Thank you for this consultation. Dictated by:   Nathen May, M.D., [facility] Attending Physician:  Pierce Crane DD:  10/04/01 TD:  10/05/01 Job: [id number]

## 2010-09-24 NOTE — Assessment & Plan Note (Signed)
[facility]                            CARDIOLOGY OFFICE NOTE   Joseph, [name]                   MRN:          [id number]  DATE:06/14/2006                            DOB:          12-Jan-1951    REFERRING PHYSICIAN:  Evelena [name], M.D.   REASON FOR CONSULTATION:  Progressive dyspnea and chest pain.   HISTORY OF PRESENT ILLNESS:  Joseph [name] is a very pleasant 60 year old  male who does not have any known history of coronary artery disease.  He  was seen by Drs. Joseph [name] and Joseph [name] in our office about 4 or 5 years ago  for palpitations and atrial tachycardia, as well as severe hypertension.  He did quite well for several years.  However, in the past 3 to 4 months  he noticed that he has been having progressive dyspnea.  He notices this  most when he is walking his Joseph [name] puppy.  As he walks up hill he  begins to get short of breath.  He also notes shortness of breath  walking up the steps at work.  He says this has definitely been getting  worse over the past few months.  He has occasional episodes of chest  pain, but these are sharp and very transient.  They are unrelated to  exercise.  There are no associated symptoms.  He denies any orthopnea,  no PND, no lower extremity edema.  He does say that his wife says that  he snores, but there is no witnessed apnea.   REVIEW OF SYSTEMS:  Negative for bright red blood per rectum, melena.  There are no recent fevers or chills.  No cough or productive sputum.  No claudication.  The remainder of the review of systems is negative.   PAST MEDICAL HISTORY:  1. Hypertension, severe.  2. Obesity.  3. Palpitations and atrial tachycardia.  4. Hyperlipidemia.   CURRENT MEDICATIONS:  1. Aspirin 81 a day.  2. Vytorin 10/40 a day.  3. Lisinopril 20 a day.  4. TriCor 145 a day.  5. Cozaar 100 a day.  6. Verapamil ER 240 a day.   ALLERGIES:  No known allergies.   SOCIAL HISTORY:  He is married  with 3 kids.  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  Denies any tobacco or alcohol use.   FAMILY HISTORY:  Joseph [name] had a bypass surgery at age 34 and died at 61  due to cancer.  Joseph [name] is alive and well at 21.  He has 1 older sister  and 1 older brother.  No history of CAD.   PHYSICAL EXAM:  He is well-appearing.  No acute distress.  Ambulates  around the clinic without any respiratory difficulty.  Blood pressure is 142/78.  On a manual recheck, it was actually 142/94.  Pulse 80s, weight 265.  HEENT:  Sclerae anicteric.  EOMI.  No xanthelasma.  Mucous membranes are  moist.  Oropharynx is clear.  NECK:  Supple with no obvious JVD.  Carotids are 2+ bilaterally without  any bruits.  There is no lymphadenopathy or thyromegaly.  CARDIAC:  Regular rate  and rhythm with a very soft systolic ejection  murmur at the left sternal border.  No rubs or gallops.  LUNGS:  Clear.  ABDOMEN:  Obese, nontender, nondistended.  No hepatosplenomegaly.  No  bruits.  No masses.  Good bowel sounds.  EXTREMITIES:  Warm with no cyanosis, clubbing, or edema.  Distal pulses  are intact.  NEURO:  Alert and oriented x3.  Cranial nerves 2-12 are intact.  Moves  all 4 extremities without difficulty.  Affect is pleasant.   EKG:  Normal sinus rhythm at a rate of 80.  Minimal nonspecific ST  scooping.  Mildly prolonged QT interval.   ASSESSMENT AND PLAN:  1. Progressive dyspnea.  Although he has not had any significant chest      discomfort, the progressive nature of his symptoms concerning to me      for a possible underlying myocardial ischemia.  We discussed the      risks and benefits of proceeding with a stress test versus going      directly to cardiac catheterization.  He prefers to go to      catheterization.  I agree with this plan.  We will proceed with a      right and left heart catheterization next week.  2. Hypertension.  Blood pressure is suboptimally controlled.  We will       add carvedilol 6.25 mg b.i.d.  3. Hyperlipidemia.  This is followed by Joseph [name].  4. Snoring and fatigue.  I suspect he has underlying sleep apnea.  We      will refer him to pulmonary for a sleep study.   DISPOSITION:  Pending the results of his catheterization next week.     Joseph [name]. Bensimhon, MD  Electronically Signed    DRB/MedQ  DD: 06/14/2006  DT: 06/14/2006  Job #: [id number]

## 2010-09-24 NOTE — Op Note (Signed)
[facility]  Patient:    Joseph [name], Joseph [name] Visit Number: [id number] MRN: [id number]          Service Type: OBV Location: 3W 0348 01 Attending Physician:  Pierce Crane Dictated by:   Javier Docker, M.D. Proc. Date: 10/03/01 Admit Date:  10/03/2001 Discharge Date: 10/04/2001                             Operative Report  PREOPERATIVE DIAGNOSES:  Degenerative joint disease, right knee.  POSTOPERATIVE DIAGNOSES:  Degenerative joint disease, right knee, medial meniscus tear.  PROCEDURE:  Right knee arthroscopy, chondroplasty of the patella, medial and lateral femoral condyle, medial and lateral tibial plateau, partial medial meniscectomy.  ANESTHESIA:  General.  ASSISTANT:  None.  BRIEF HISTORY:  A 60 year old with refractory knee pain status post arthroscopy in the past, persistent refractory knee pain and effusion. X-rays indicated degenerative changes particularly of the medial compartment. Operative intervention was indicated for irrigation and debridement of chondroplasty, review of his meniscus and evacuation. We discussed the risks and benefits including bleeding, infection, damage to neurovascular structures, no change in symptoms, need for total knee arthroplasty, etc.  TECHNIQUE:  The patient in the supine position after the induction of adequate general anesthesia, the right lower extremity was prepped and draped in the usual sterile fashion. One gram of Kefzol was given for antimicrobial prophylaxis. A lateral parapatellar portal and a superior medial parapatellar portal was fashioned with a #11 blade. An ingrowth cannula atraumatically placed, irrigant was utilized to insufflate the joint. Under direct visualization, a medial parapatellar portal was fashioned with a #11 blade after localization with an 18 gauge needle sparing the medial meniscus. Significant moderate synovial fluid was evacuated and was clear.  Inspection revealed extensive grade 3 changes of the patellofemoral joint as well as grade 4 changes. Extensive grade 3 changes of the medial and lateral femoral condyle more particularly on the medial femoral condyle, extensive grade 3 changes of the tibial plateaus bilaterally, grade 3 changes in the medial femoral condyle. There was a radial tear in the medial meniscus with some displacement of the fragment and a basket rongeur was introduced, partial medial meniscectomy was performed which was then contoured to a stable base. Chondroplasties were performed of the femoral condyles, the tibial plateaus, the patella. The knee was then copiously lavaged. I examined the gutter, fully examined the suprapatellar pouch and no evidence of residual loose debris was noted. ACL and PCL were found to be attenuated. There were osteophytic spurs noted on the tibial plateau. Menisci were stable to propalpation. Next all instrumentation was removed, after copious lavage the portals were closed with 4-0 nylon simple sutures. A 0.25% Marcaine with epinephrine was infiltrated in the joint. The wound was dressed sterilely. The patient was awakened without difficulty, transported to the recovery room in satisfactory condition.  The patient tolerated the procedure well. The patient had some cardiac ectopy upon induction which resolved after full induction. Will obtain a cardiology consult postoperatively. Dictated by:   Javier Docker, M.D. Attending Physician:  Pierce Crane DD:  10/03/01 TD:  10/05/01 Job: [id number]

## 2010-11-26 ENCOUNTER — Telehealth: Payer: Self-pay | Admitting: *Deleted

## 2010-11-26 NOTE — Telephone Encounter (Signed)
Per note from pt, Tricor 90 days $300.00.  Vytorin 90 days $296 Change to Fenofibrate 160 and Lipitor 40.  Pt just got refills, requested we wait to fill until he needs, and also he has CPX in September, can discuss further at that time

## 2011-02-07 LAB — BASIC METABOLIC PANEL
CO2: 24
Chloride: 103
GFR calc Af Amer: >60
Potassium: 4.2
Sodium: 136

## 2011-03-08 ENCOUNTER — Encounter: Payer: Self-pay | Admitting: Family Medicine

## 2011-03-08 ENCOUNTER — Ambulatory Visit (INDEPENDENT_AMBULATORY_CARE_PROVIDER_SITE_OTHER): Payer: BC Managed Care – PPO | Admitting: Family Medicine

## 2011-03-08 DIAGNOSIS — Z125 Encounter for screening for malignant neoplasm of prostate: Secondary | ICD-10-CM

## 2011-03-08 DIAGNOSIS — I10 Essential (primary) hypertension: Secondary | ICD-10-CM

## 2011-03-08 DIAGNOSIS — E785 Hyperlipidemia, unspecified: Secondary | ICD-10-CM

## 2011-03-08 LAB — BASIC METABOLIC PANEL
CO2: 26 mEq/L (ref 19–32)
Calcium: 9.6 mg/dL (ref 8.4–10.5)
Chloride: 106 mEq/L (ref 96–112)
Sodium: 140 mEq/L (ref 135–145)

## 2011-03-08 LAB — HEPATIC FUNCTION PANEL: Albumin: 4.4 g/dL (ref 3.5–5.2)

## 2011-03-08 LAB — PSA: PSA: 0.96 ng/mL (ref 0.10–4.00)

## 2011-03-08 LAB — LIPID PANEL
HDL: 50 mg/dL (ref >39.00)
LDL Cholesterol: 108 mg/dL — ABNORMAL HIGH (ref 0–99)
Total CHOL/HDL Ratio: 4
Triglycerides: 182 mg/dL — ABNORMAL HIGH (ref 0.0–149.0)

## 2011-03-08 MED ORDER — LOSARTAN POTASSIUM 100 MG PO TABS: 100.0000 mg | ORAL_TABLET | Freq: Every day | ORAL | Status: DC

## 2011-03-08 MED ORDER — LISINOPRIL-HYDROCHLOROTHIAZIDE 20-12.5 MG PO TABS: 1.0000 | ORAL_TABLET | Freq: Every day | ORAL | Status: DC

## 2011-03-08 MED ORDER — TRIAMCINOLONE ACETONIDE 0.1 % EX CREA: TOPICAL_CREAM | Freq: Two times a day (BID) | CUTANEOUS | Status: DC

## 2011-03-08 MED ORDER — VERAPAMIL HCL 240 MG (CO) PO TB24: 240.0000 mg | ORAL_TABLET | Freq: Every day | ORAL | Status: DC

## 2011-03-08 MED ORDER — FENOFIBRATE 160 MG PO TABS: 160.0000 mg | ORAL_TABLET | Freq: Every day | ORAL | Status: DC

## 2011-03-08 MED ORDER — ATORVASTATIN CALCIUM 40 MG PO TABS: 40.0000 mg | ORAL_TABLET | Freq: Every day | ORAL | Status: DC

## 2011-03-08 NOTE — Progress Notes (Signed)
Quick Note:  Pt informed on home VM ______ 

## 2011-03-08 NOTE — Progress Notes (Signed)
  Subjective:    Patient ID: Joseph [name], male    DOB: September 01, 1950, 60 y.o.   MRN: [id number]  HPI  Medical followup. Patient has history of hyperlipidemia and hypertension. Also past history of transient atrial fibrillation. Denies any recent palpitations or dizziness. Compliant with all medications. He has requested changing a couple medications to generic. We plan to switch his Vytorin to Lipitor and TriCor to generic fenofibrate.  No history of CAD. Denies medication side effects. Nonsmoker  Past Medical History  Diagnosis Date  . Hyperlipidemia   . Hypertension   . Atrial fibrillation    Past Surgical History  Procedure Date  . Knee scopes 1992, 2003  . Total knee arthroplasty 2005    right     reports that he has never smoked. He does not have any smokeless tobacco history on file. His alcohol and drug histories not on file. family history includes Cancer in his father and other and Heart disease in his father. No Known Allergies    Review of Systems  Constitutional: Negative for appetite change, fatigue and unexpected weight change.  Respiratory: Negative for cough and shortness of breath.   Cardiovascular: Negative for chest pain, palpitations and leg swelling.  Gastrointestinal: Negative for abdominal pain.  Genitourinary: Negative for dysuria.  Neurological: Negative for dizziness, syncope and headaches.  Hematological: Negative for adenopathy. Does not bruise/bleed easily.       Objective:   Physical Exam  Constitutional: He is oriented to person, place, and time. He appears well-developed and well-nourished.  HENT:  Right Ear: External ear normal.  Left Ear: External ear normal.  Mouth/Throat: Oropharynx is clear and moist.  Neck: Neck supple. No thyromegaly present.  Cardiovascular: Normal rate, regular rhythm and normal heart sounds.   No murmur heard. Pulmonary/Chest: Effort normal and breath sounds normal. No respiratory distress. He has no wheezes.  He has no rales.  Musculoskeletal: He exhibits no edema.  Lymphadenopathy:    He has no cervical adenopathy.  Neurological: He is alert and oriented to person, place, and time.          Assessment & Plan:  #1 hypertension stable refill all medications. Check basic metabolic panel #2 dyslipidemia. Change to Lipitor 40 mg daily. Check lipid and hepatic panel #3 atrial fibrillation, transient in past. Currently sinus rhythm #4 health maintenance. Flu vaccine offered but declined. Patient requests repeat PSA

## 2011-03-24 ENCOUNTER — Other Ambulatory Visit: Payer: Self-pay | Admitting: *Deleted

## 2011-03-24 MED ORDER — TRIAMCINOLONE ACETONIDE 0.1 % EX CREA: TOPICAL_CREAM | Freq: Two times a day (BID) | CUTANEOUS | Status: DC

## 2012-01-26 ENCOUNTER — Other Ambulatory Visit: Payer: Self-pay | Admitting: *Deleted

## 2012-01-26 MED ORDER — TRIAMCINOLONE ACETONIDE 0.1 % EX CREA: TOPICAL_CREAM | Freq: Two times a day (BID) | CUTANEOUS | Status: DC

## 2012-02-24 ENCOUNTER — Other Ambulatory Visit: Payer: Self-pay | Admitting: Family Medicine

## 2012-03-06 ENCOUNTER — Ambulatory Visit (INDEPENDENT_AMBULATORY_CARE_PROVIDER_SITE_OTHER): Payer: BC Managed Care – PPO | Admitting: Family Medicine

## 2012-03-06 ENCOUNTER — Encounter: Payer: Self-pay | Admitting: Family Medicine

## 2012-03-06 VITALS — BP 138/78 | Temp 98.6°F | Wt 256.0 lb

## 2012-03-06 DIAGNOSIS — R5383 Other fatigue: Secondary | ICD-10-CM

## 2012-03-06 DIAGNOSIS — I10 Essential (primary) hypertension: Secondary | ICD-10-CM

## 2012-03-06 DIAGNOSIS — E785 Hyperlipidemia, unspecified: Secondary | ICD-10-CM

## 2012-03-06 DIAGNOSIS — Z125 Encounter for screening for malignant neoplasm of prostate: Secondary | ICD-10-CM

## 2012-03-06 DIAGNOSIS — R5381 Other malaise: Secondary | ICD-10-CM

## 2012-03-06 LAB — HEPATIC FUNCTION PANEL
Albumin: 4.1 g/dL (ref 3.5–5.2)
Total Protein: 6.8 g/dL (ref 6.0–8.3)

## 2012-03-06 LAB — LIPID PANEL
Cholesterol: 224 mg/dL — ABNORMAL HIGH (ref 0–200)
HDL: 49.1 mg/dL (ref >39.00)
Total CHOL/HDL Ratio: 5
Triglycerides: 200 mg/dL — ABNORMAL HIGH (ref 0.0–149.0)
VLDL: 40 mg/dL (ref 0.0–40.0)

## 2012-03-06 LAB — PSA: PSA: 1.03 ng/mL (ref 0.10–4.00)

## 2012-03-06 LAB — TESTOSTERONE: Testosterone: 307.79 ng/dL — ABNORMAL LOW (ref 350.00–890.00)

## 2012-03-06 LAB — LDL CHOLESTEROL, DIRECT: Direct LDL: 155 mg/dL

## 2012-03-06 LAB — BASIC METABOLIC PANEL
CO2: 25 mEq/L (ref 19–32)
GFR: 40.76 mL/min — ABNORMAL LOW (ref >60.00)
Glucose, Bld: 96 mg/dL (ref 70–99)
Potassium: 4.7 mEq/L (ref 3.5–5.1)
Sodium: 138 mEq/L (ref 135–145)

## 2012-03-06 MED ORDER — LISINOPRIL-HYDROCHLOROTHIAZIDE 20-12.5 MG PO TABS: 1.0000 | ORAL_TABLET | Freq: Every day | ORAL | Status: DC

## 2012-03-06 MED ORDER — LOSARTAN POTASSIUM 100 MG PO TABS: 100.0000 mg | ORAL_TABLET | Freq: Every day | ORAL | Status: DC

## 2012-03-06 MED ORDER — ATORVASTATIN CALCIUM 40 MG PO TABS: 40.0000 mg | ORAL_TABLET | Freq: Every day | ORAL | Status: DC

## 2012-03-06 MED ORDER — FENOFIBRATE 160 MG PO TABS: 160.0000 mg | ORAL_TABLET | Freq: Every day | ORAL | Status: DC

## 2012-03-06 MED ORDER — VERAPAMIL HCL ER 240 MG PO TBCR: 240.0000 mg | EXTENDED_RELEASE_TABLET | Freq: Every day | ORAL | Status: DC

## 2012-03-06 NOTE — Patient Instructions (Addendum)
Call when you are ready to schedule colonoscopy.

## 2012-03-06 NOTE — Progress Notes (Signed)
  Subjective:    Patient ID: Joseph [name], male    DOB: December 01, 1950, 61 y.o.   MRN: [id number]  HPI  Patient seen for medical followup. He has history of hypertension and hyperlipidemia. Compliant with all medications. No side effects. Denies any recent chest pains or dizziness. Walks about one and 1/2 miles per day for exercise. Needs refills today of all medications. Declines flu vaccine.  He has history of transient atrial fibrillation in the past. He does not have any history of any valvular heart problems. No recent palpitations.  Complaint of some progressive fatigue issues. He's read about low testosterone and concern for this. Some low libido. Low initiative. Never been tested previously. Denies depression.  Last colonoscopy about 10 years ago. He would like to consider repeat screening this spring. He'll call back for referral.  Past Medical History  Diagnosis Date  . Hyperlipidemia   . Hypertension   . Atrial fibrillation    Past Surgical History  Procedure Date  . Knee scopes 1992, 2003  . Total knee arthroplasty 2005    right     reports that he has never smoked. He does not have any smokeless tobacco history on file. His alcohol and drug histories not on file. family history includes Cancer in his father and other and Heart disease in his father. No Known Allergies    Review of Systems  Constitutional: Positive for fatigue.  Eyes: Negative for visual disturbance.  Respiratory: Negative for cough, chest tightness and shortness of breath.   Cardiovascular: Negative for chest pain, palpitations and leg swelling.  Neurological: Negative for dizziness, syncope, weakness, light-headedness and headaches.  Psychiatric/Behavioral: Negative for dysphoric mood.       Objective:   Physical Exam  Constitutional: He is oriented to person, place, and time. He appears well-developed and well-nourished.  Neck: Neck supple. No thyromegaly present.  Cardiovascular: Normal  rate and regular rhythm.   No murmur heard. Pulmonary/Chest: Effort normal and breath sounds normal. No respiratory distress. He has no wheezes. He has no rales.  Musculoskeletal: He exhibits no edema.  Neurological: He is alert and oriented to person, place, and time.  Psychiatric: He has a normal mood and affect. His behavior is normal.          Assessment & Plan:  #1 hypertension. Stable. Refilled medications for one year  #2 hyperlipidemia. Check lipid and hepatic panel. Refill Lipitor for one year  #3 progressive fatigue. Check total testosterone level.  #4 health maintenance. Needs repeat colonoscopy. He will call back when ready to schedule. Flu vaccine declined.

## 2012-03-19 ENCOUNTER — Encounter: Payer: Self-pay | Admitting: Family Medicine

## 2012-04-25 ENCOUNTER — Other Ambulatory Visit (INDEPENDENT_AMBULATORY_CARE_PROVIDER_SITE_OTHER): Payer: BC Managed Care – PPO

## 2012-04-25 DIAGNOSIS — E785 Hyperlipidemia, unspecified: Secondary | ICD-10-CM

## 2012-04-25 DIAGNOSIS — R5383 Other fatigue: Secondary | ICD-10-CM

## 2012-04-25 DIAGNOSIS — I10 Essential (primary) hypertension: Secondary | ICD-10-CM

## 2012-04-25 LAB — BASIC METABOLIC PANEL
BUN: 31 mg/dL — ABNORMAL HIGH (ref 6–23)
CO2: 23 mEq/L (ref 19–32)
Chloride: 104 mEq/L (ref 96–112)
GFR: 47.32 mL/min — ABNORMAL LOW (ref >60.00)
Glucose, Bld: 112 mg/dL — ABNORMAL HIGH (ref 70–99)
Potassium: 4.2 mEq/L (ref 3.5–5.1)
Sodium: 137 mEq/L (ref 135–145)

## 2012-04-25 LAB — LIPID PANEL: HDL: 48.9 mg/dL (ref >39.00)

## 2012-05-01 ENCOUNTER — Encounter: Payer: Self-pay | Admitting: Family Medicine

## 2012-05-01 DIAGNOSIS — Z8639 Personal history of other endocrine, nutritional and metabolic disease: Secondary | ICD-10-CM

## 2012-05-07 NOTE — Telephone Encounter (Signed)
Start Androgel 1.62% (one spray pump actuation per arm once daily) and patient will need repeat testosterone within one month after starting this.

## 2012-05-14 MED ORDER — TESTOSTERONE 20.25 MG/ACT (1.62%) TD GEL: 1.0000 | Freq: Every morning | TRANSDERMAL | Status: DC

## 2012-05-23 ENCOUNTER — Encounter: Payer: Self-pay | Admitting: Family Medicine

## 2012-06-05 ENCOUNTER — Other Ambulatory Visit: Payer: Self-pay | Admitting: *Deleted

## 2012-06-05 NOTE — Telephone Encounter (Signed)
cetaphil lotion added to med list

## 2012-06-23 ENCOUNTER — Other Ambulatory Visit: Payer: Self-pay

## 2012-08-09 ENCOUNTER — Encounter: Payer: Self-pay | Admitting: Family Medicine

## 2013-03-05 ENCOUNTER — Ambulatory Visit: Payer: BC Managed Care – PPO | Admitting: Family Medicine

## 2013-03-12 ENCOUNTER — Encounter: Payer: Self-pay | Admitting: Family Medicine

## 2013-03-12 ENCOUNTER — Ambulatory Visit (INDEPENDENT_AMBULATORY_CARE_PROVIDER_SITE_OTHER): Payer: BC Managed Care – PPO | Admitting: Family Medicine

## 2013-03-12 VITALS — BP 132/80 | HR 72 | Temp 98.0°F | Wt 268.0 lb

## 2013-03-12 DIAGNOSIS — Z125 Encounter for screening for malignant neoplasm of prostate: Secondary | ICD-10-CM

## 2013-03-12 DIAGNOSIS — R7989 Other specified abnormal findings of blood chemistry: Secondary | ICD-10-CM | POA: Insufficient documentation

## 2013-03-12 DIAGNOSIS — E291 Testicular hypofunction: Secondary | ICD-10-CM

## 2013-03-12 DIAGNOSIS — I10 Essential (primary) hypertension: Secondary | ICD-10-CM

## 2013-03-12 DIAGNOSIS — E785 Hyperlipidemia, unspecified: Secondary | ICD-10-CM

## 2013-03-12 DIAGNOSIS — E669 Obesity, unspecified: Secondary | ICD-10-CM | POA: Insufficient documentation

## 2013-03-12 LAB — PSA: PSA: 0.81 ng/mL (ref 0.10–4.00)

## 2013-03-12 LAB — LIPID PANEL
HDL: 52.9 mg/dL (ref >39.00)
Total CHOL/HDL Ratio: 4
VLDL: 40 mg/dL (ref 0.0–40.0)

## 2013-03-12 LAB — BASIC METABOLIC PANEL
BUN: 37 mg/dL — ABNORMAL HIGH (ref 6–23)
CO2: 26 mEq/L (ref 19–32)
Chloride: 106 mEq/L (ref 96–112)
GFR: 44.58 mL/min — ABNORMAL LOW (ref >60.00)
Glucose, Bld: 92 mg/dL (ref 70–99)
Potassium: 4.8 mEq/L (ref 3.5–5.1)
Sodium: 140 mEq/L (ref 135–145)

## 2013-03-12 LAB — HEPATIC FUNCTION PANEL: Total Bilirubin: 0.8 mg/dL (ref 0.3–1.2)

## 2013-03-12 LAB — LDL CHOLESTEROL, DIRECT: Direct LDL: 154.2 mg/dL

## 2013-03-12 NOTE — Patient Instructions (Signed)
Let me know when you are ready to set up colonoscopy.

## 2013-03-12 NOTE — Progress Notes (Signed)
  Subjective:    Patient ID: Joseph [name], male    DOB: [birth year], 62 y.o.   MRN: [id number]  HPI Patient seen for medical follow up He has history of hypertension, past history of transient atrial fibrillation, and hyperlipidemia His father had history of CAD. Patient has never had any history of CAD or peripheral vascular disease. Nonsmoker. History of hypogonadism. He was on testosterone replacement but did not see any symptomatic improvement and stopped this. Current medications reviewed and compliant with all.  He walks for exercise most days of week. No exercise intolerance. No dizziness. No chest pains. He declines flu vaccine. No history of shingles vaccine. He will check on insurance coverage. He needs repeat colonoscopy but is not ready to set up yet.  Past Medical History  Diagnosis Date  . Hyperlipidemia   . Hypertension   . Atrial fibrillation    Past Surgical History  Procedure Laterality Date  . Knee scopes  1992, 2003  . Total knee arthroplasty  2005    right     reports that he has never smoked. He does not have any smokeless tobacco history on file. His alcohol and drug histories are not on file. family history includes Cancer in his father and other; Heart disease in his father. No Known Allergies    Review of Systems  Constitutional: Negative for fatigue and unexpected weight change.  Eyes: Negative for visual disturbance.  Respiratory: Negative for cough, chest tightness and shortness of breath.   Cardiovascular: Negative for chest pain, palpitations and leg swelling.  Gastrointestinal: Negative for nausea, vomiting, diarrhea and constipation.  Endocrine: Negative for polydipsia and polyuria.  Neurological: Negative for dizziness, syncope, weakness, light-headedness and headaches.       Objective:   Physical Exam  Constitutional: He appears well-developed and well-nourished. No distress.  Neck: Neck supple. No thyromegaly present.   Cardiovascular: Normal rate and regular rhythm.   Pulmonary/Chest: Effort normal and breath sounds normal. No respiratory distress. He has no wheezes. He has no rales.  Musculoskeletal: He exhibits no edema.  Lymphadenopathy:    He has no cervical adenopathy.          Assessment & Plan:  #1 hypertension. Stable. Check basic metabolic panel. Continue current medications #2 history of dyslipidemia. Patient on combination of statin and fenofibrate. No myalgias. Check hepatic panel and lipid panel #3 health maintenance. Flu vaccine recommended and declined. Patient will call back when ready to schedule colonoscopy. Check PSA. #4 obesity. We discussed weight loss issues

## 2013-04-22 ENCOUNTER — Other Ambulatory Visit: Payer: Self-pay

## 2013-04-22 ENCOUNTER — Encounter: Payer: Self-pay | Admitting: Family Medicine

## 2013-04-22 DIAGNOSIS — I10 Essential (primary) hypertension: Secondary | ICD-10-CM

## 2013-04-22 DIAGNOSIS — E785 Hyperlipidemia, unspecified: Secondary | ICD-10-CM

## 2013-04-22 MED ORDER — LISINOPRIL-HYDROCHLOROTHIAZIDE 20-12.5 MG PO TABS: 1.0000 | ORAL_TABLET | Freq: Every day | ORAL | Status: DC

## 2013-04-22 MED ORDER — ATORVASTATIN CALCIUM 40 MG PO TABS: 40.0000 mg | ORAL_TABLET | Freq: Every day | ORAL | Status: DC

## 2013-04-22 MED ORDER — LOSARTAN POTASSIUM 100 MG PO TABS
100.0000 mg | ORAL_TABLET | Freq: Every day | ORAL | Status: DC
Start: 2013-04-22 — End: 2014-01-29

## 2013-04-22 MED ORDER — VERAPAMIL HCL ER 240 MG PO TBCR: 240.0000 mg | EXTENDED_RELEASE_TABLET | Freq: Every day | ORAL | Status: DC

## 2013-04-22 MED ORDER — FENOFIBRATE 160 MG PO TABS: 160.0000 mg | ORAL_TABLET | Freq: Every day | ORAL | Status: DC

## 2013-07-10 ENCOUNTER — Telehealth: Payer: Self-pay | Admitting: Family Medicine

## 2013-07-10 MED ORDER — TRIAMCINOLONE ACETONIDE 0.1 % EX CREA
TOPICAL_CREAM | Freq: Two times a day (BID) | CUTANEOUS | Status: DC
Start: 2013-07-10 — End: 2014-05-07

## 2013-07-10 NOTE — Telephone Encounter (Signed)
[facility] - [place], [place] - [address] requesting refill of triamcinolone cream (KENALOG) 0.1 %

## 2013-07-10 NOTE — Telephone Encounter (Signed)
RX sent to pharmacy  

## 2014-01-29 ENCOUNTER — Telehealth: Payer: Self-pay | Admitting: Family Medicine

## 2014-01-29 DIAGNOSIS — E785 Hyperlipidemia, unspecified: Secondary | ICD-10-CM

## 2014-01-29 DIAGNOSIS — I10 Essential (primary) hypertension: Secondary | ICD-10-CM

## 2014-01-29 MED ORDER — VERAPAMIL HCL ER 240 MG PO TBCR: 240.0000 mg | EXTENDED_RELEASE_TABLET | Freq: Every day | ORAL | Status: DC

## 2014-01-29 MED ORDER — LISINOPRIL-HYDROCHLOROTHIAZIDE 20-12.5 MG PO TABS: 1.0000 | ORAL_TABLET | Freq: Every day | ORAL | Status: DC

## 2014-01-29 MED ORDER — FENOFIBRATE 160 MG PO TABS: 160.0000 mg | ORAL_TABLET | Freq: Every day | ORAL | Status: DC

## 2014-01-29 MED ORDER — LOSARTAN POTASSIUM 100 MG PO TABS: 100.0000 mg | ORAL_TABLET | Freq: Every day | ORAL | Status: DC

## 2014-01-29 MED ORDER — ATORVASTATIN CALCIUM 40 MG PO TABS: 40.0000 mg | ORAL_TABLET | Freq: Every day | ORAL | Status: DC

## 2014-01-29 NOTE — Telephone Encounter (Signed)
[id number] - [place], [place] - [address] is requesting 90 day re-fills on the following: lisinopril-hydrochlorothiazide (PRINZIDE,ZESTORETIC) 20-12.5 MG per tablet atorvastatin (LIPITOR) 40 MG tablet losartan (COZAAR) 100 MG tablet verapamil (CALAN-SR) 240 MG CR tablet fenofibrate 160 MG tablet

## 2014-01-29 NOTE — Telephone Encounter (Signed)
Rx's sent to pharmacy.  

## 2014-03-05 ENCOUNTER — Other Ambulatory Visit (INDEPENDENT_AMBULATORY_CARE_PROVIDER_SITE_OTHER): Payer: BC Managed Care – PPO

## 2014-03-05 DIAGNOSIS — E785 Hyperlipidemia, unspecified: Secondary | ICD-10-CM

## 2014-03-05 DIAGNOSIS — Z0000 Encounter for general adult medical examination without abnormal findings: Secondary | ICD-10-CM

## 2014-03-05 LAB — BASIC METABOLIC PANEL
BUN: 32 mg/dL — ABNORMAL HIGH (ref 6–23)
CO2: 19 mEq/L (ref 19–32)
CREATININE: 1.6 mg/dL — AB (ref 0.4–1.5)
Calcium: 9.8 mg/dL (ref 8.4–10.5)
Chloride: 107 mEq/L (ref 96–112)
GFR: 46.03 mL/min — AB (ref >60.00)
Glucose, Bld: 89 mg/dL (ref 70–99)
Potassium: 4.2 mEq/L (ref 3.5–5.1)
Sodium: 139 mEq/L (ref 135–145)

## 2014-03-05 LAB — CBC WITH DIFFERENTIAL/PLATELET
Basophils Absolute: 0 10*3/uL (ref 0.0–0.1)
Basophils Relative: 0.4 % (ref 0.0–3.0)
EOS PCT: 4.2 % (ref 0.0–5.0)
Eosinophils Absolute: 0.3 10*3/uL (ref 0.0–0.7)
HCT: 43.2 % (ref 39.0–52.0)
Hemoglobin: 14.3 g/dL (ref 13.0–17.0)
Lymphocytes Relative: 25.5 % (ref 12.0–46.0)
Lymphs Abs: 1.9 10*3/uL (ref 0.7–4.0)
MCHC: 33 g/dL (ref 30.0–36.0)
MCV: 93.3 fl (ref 78.0–100.0)
MONO ABS: 0.9 10*3/uL (ref 0.1–1.0)
Monocytes Relative: 11.5 % (ref 3.0–12.0)
NEUTROS ABS: 4.4 10*3/uL (ref 1.4–7.7)
NEUTROS PCT: 58.4 % (ref 43.0–77.0)
Platelets: 182 10*3/uL (ref 150.0–400.0)
RBC: 4.63 Mil/uL (ref 4.22–5.81)
RDW: 13.7 % (ref 11.5–15.5)
WBC: 7.6 10*3/uL (ref 4.0–10.5)

## 2014-03-05 LAB — POCT URINALYSIS DIPSTICK
Blood, UA: NEGATIVE
GLUCOSE UA: NEGATIVE
LEUKOCYTES UA: NEGATIVE
Nitrite, UA: NEGATIVE
PROTEIN UA: NEGATIVE
Spec Grav, UA: 1.025
UROBILINOGEN UA: 0.2
pH, UA: 5

## 2014-03-05 LAB — HEPATIC FUNCTION PANEL
ALT: 24 U/L (ref 0–53)
AST: 28 U/L (ref 0–37)
Albumin: 3.9 g/dL (ref 3.5–5.2)
Alkaline Phosphatase: 32 U/L — ABNORMAL LOW (ref 39–117)
BILIRUBIN DIRECT: 0.2 mg/dL (ref 0.0–0.3)
Total Bilirubin: 0.9 mg/dL (ref 0.2–1.2)
Total Protein: 7.1 g/dL (ref 6.0–8.3)

## 2014-03-05 LAB — TSH: TSH: 1.2 u[IU]/mL (ref 0.35–4.50)

## 2014-03-05 LAB — LIPID PANEL
Cholesterol: 233 mg/dL — ABNORMAL HIGH (ref 0–200)
HDL: 50.5 mg/dL (ref >39.00)
NonHDL: 182.5
Total CHOL/HDL Ratio: 5
Triglycerides: 222 mg/dL — ABNORMAL HIGH (ref 0.0–149.0)
VLDL: 44.4 mg/dL — AB (ref 0.0–40.0)

## 2014-03-05 LAB — LDL CHOLESTEROL, DIRECT: LDL DIRECT: 146.9 mg/dL

## 2014-03-05 LAB — PSA: PSA: 1.04 ng/mL (ref 0.10–4.00)

## 2014-03-05 NOTE — Addendum Note (Signed)
Addended by: Alfred LevinsWYRICK, CINDY D on: 03/05/2014 12:07 PM   Modules accepted: Orders

## 2014-03-19 ENCOUNTER — Ambulatory Visit (INDEPENDENT_AMBULATORY_CARE_PROVIDER_SITE_OTHER): Payer: BC Managed Care – PPO | Admitting: Family Medicine

## 2014-03-19 ENCOUNTER — Encounter: Payer: Self-pay | Admitting: Family Medicine

## 2014-03-19 VITALS — BP 124/80 | HR 92 | Temp 98.5°F | Ht 72.0 in | Wt 257.0 lb

## 2014-03-19 DIAGNOSIS — Z0000 Encounter for general adult medical examination without abnormal findings: Secondary | ICD-10-CM

## 2014-03-19 NOTE — Progress Notes (Signed)
Pre visit review using our clinic review tool, if applicable. No additional management support is needed unless otherwise documented below in the visit note. 

## 2014-03-19 NOTE — Patient Instructions (Signed)
Check on insurance coverage for shingles vaccine Consider repeat colonoscopy this year and let me know when ready to schedule.

## 2014-03-19 NOTE — Progress Notes (Signed)
   Subjective:    Patient ID: Joseph SarnaJeffrey A [name], male    DOB: [birth year], 63 y.o.   MRN: [id number]  HPI Patient seen for complete physical.his chronic problems include history of hypertension, hyperlipidemia, past history of atrial fibrillation, obesity.no history of CAD. 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  No recent chest pains. Walks for exercise. Needs repeat colonoscopy but he is not interested in scheduling yet. Has not checked on coverage for shingles vaccine. Declines flu vaccine. Tetanus up-to-date.  Past Medical History  Diagnosis Date  . Hyperlipidemia   . Hypertension   . Atrial fibrillation    Past Surgical History  Procedure Laterality Date  . Knee scopes  1992, 2003  . Total knee arthroplasty  2005    right     reports that he has never smoked. He does not have any smokeless tobacco history on file. His alcohol and drug histories are not on file. family history includes Cancer in his father and other; Heart disease in his father. No Known Allergies    Review of Systems  Constitutional: Negative for fever, activity change, appetite change, fatigue and unexpected weight change.  HENT: Negative for congestion, ear pain and trouble swallowing.   Eyes: Negative for pain and visual disturbance.  Respiratory: Negative for cough, shortness of breath and wheezing.   Cardiovascular: Negative for chest pain and palpitations.  Gastrointestinal: Negative for nausea, vomiting, abdominal pain, diarrhea, constipation, blood in stool, abdominal distention and rectal pain.  Endocrine: Negative for polydipsia and polyuria.  Genitourinary: Negative for dysuria, hematuria and testicular pain.  Musculoskeletal: Negative for joint swelling and arthralgias.  Skin: Negative for rash.  Neurological: Negative for dizziness, syncope and headaches.  Hematological: Negative for adenopathy.  Psychiatric/Behavioral: Negative  for confusion and dysphoric mood.       Objective:   Physical Exam  Constitutional: He is oriented to person, place, and time. He appears well-developed and well-nourished. No distress.  HENT:  Head: Normocephalic and atraumatic.  Right Ear: External ear normal.  Left Ear: External ear normal.  Mouth/Throat: Oropharynx is clear and moist.  Eyes: Conjunctivae and EOM are normal. Pupils are equal, round, and reactive to light.  Neck: Normal range of motion. Neck supple. No thyromegaly present.  Cardiovascular: Normal rate, regular rhythm and normal heart sounds.   No murmur heard. Pulmonary/Chest: No respiratory distress. He has no wheezes. He has no rales.  Abdominal: Soft. Bowel sounds are normal. He exhibits no distension and no mass. There is no tenderness. There is no rebound and no guarding.  Genitourinary:  Patient declines  Musculoskeletal: He exhibits no edema.  Lymphadenopathy:    He has no cervical adenopathy.  Neurological: He is alert and oriented to person, place, and time. He displays normal reflexes. No cranial nerve deficit.  Skin: No rash noted.  Psychiatric: He has a normal mood and affect.          Assessment & Plan:  Complete physical. We have encouraged him to check on coverage for shingles vaccine. He agrees to consider colonoscopy next spring and he will let us know when read to schedule.he declines flu vaccine. Labs reviewed and these appear to be stable.

## 2014-04-07 ENCOUNTER — Ambulatory Visit (INDEPENDENT_AMBULATORY_CARE_PROVIDER_SITE_OTHER): Payer: BC Managed Care – PPO | Admitting: Family Medicine

## 2014-04-07 DIAGNOSIS — Z23 Encounter for immunization: Secondary | ICD-10-CM

## 2014-04-30 ENCOUNTER — Other Ambulatory Visit: Payer: Self-pay | Admitting: Family Medicine

## 2014-05-07 ENCOUNTER — Telehealth: Payer: Self-pay | Admitting: Family Medicine

## 2014-05-07 ENCOUNTER — Encounter: Payer: Self-pay | Admitting: Family Medicine

## 2014-05-07 ENCOUNTER — Other Ambulatory Visit: Payer: Self-pay | Admitting: Family Medicine

## 2014-05-07 NOTE — Telephone Encounter (Signed)
Would do half and half.

## 2014-05-07 NOTE — Telephone Encounter (Signed)
Pharm would like to know how much CETAPHIL do you want in this rx?  Half and half?  triamcinolone cream (KENALOG) 0.1 % 240 g 0 05/07/2014      Sig: APPLY TOPICALLY TWICE DAILY    Notes to Pharmacy: Ingredients: TRIAMCINOLONE /CETAPHIL MOIST.

## 2014-05-08 NOTE — Telephone Encounter (Signed)
Left message on pharmacy voicemail with directions

## 2014-08-05 ENCOUNTER — Encounter: Payer: Self-pay | Admitting: Podiatry

## 2014-08-05 ENCOUNTER — Ambulatory Visit: Payer: Self-pay

## 2014-08-05 ENCOUNTER — Ambulatory Visit (INDEPENDENT_AMBULATORY_CARE_PROVIDER_SITE_OTHER): Payer: BLUE CROSS/BLUE SHIELD | Admitting: Podiatry

## 2014-08-05 VITALS — BP 121/71 | HR 77 | Resp 15

## 2014-08-05 DIAGNOSIS — M79671 Pain in right foot: Secondary | ICD-10-CM | POA: Diagnosis not present

## 2014-08-05 DIAGNOSIS — M2041 Other hammer toe(s) (acquired), right foot: Secondary | ICD-10-CM | POA: Diagnosis not present

## 2014-08-05 DIAGNOSIS — M79672 Pain in left foot: Secondary | ICD-10-CM

## 2014-08-05 NOTE — Progress Notes (Signed)
   Subjective:    Patient ID: Joseph SarnaJeffrey A Larios, male    DOB: 1950-05-22, 64 y.o.   MRN: [id number]  HPI  Pt presents with hammertoe deformation on right foot, states that he is interested in surgery to repair the deformity, he also c/o aching in big toe joint on his left foot, pain comes and goes  Review of Systems  All other systems reviewed and are negative.      Objective:   Physical Exam        Assessment & Plan:

## 2014-08-05 NOTE — Patient Instructions (Signed)
Pre-Operative Instructions  Congratulations, you have decided to take an important step to improving your quality of life.  You can be assured that the doctors of [facility] will be with you every step of the way.  1. Plan to be at the surgery center/hospital at least 1 (one) hour prior to your scheduled time unless otherwise directed by the surgical center/hospital staff.  You must have a responsible adult accompany you, remain during the surgery and drive you home.  Make sure you have directions to the surgical center/hospital and know how to get there on time. 2. For hospital based surgery you will need to obtain a history and physical form from your family physician within 1 month prior to the date of surgery- we will give you a form for you primary physician.  3. We make every effort to accommodate the date you request for surgery.  There are however, times where surgery dates or times have to be moved.  We will contact you as soon as possible if a change in schedule is required.   4. No Aspirin/Ibuprofen for one week before surgery.  If you are on aspirin, any non-steroidal anti-inflammatory medications (Mobic, Aleve, Ibuprofen) you should stop taking it 7 days prior to your surgery.  You make take Tylenol  For pain prior to surgery.  5. Medications- If you are taking daily heart and blood pressure medications, seizure, reflux, allergy, asthma, anxiety, pain or diabetes medications, make sure the surgery center/hospital is aware before the day of surgery so they may notify you which medications to take or avoid the day of surgery. 6. No food or drink after midnight the night before surgery unless directed otherwise by surgical center/hospital staff. 7. No alcoholic beverages 24 hours prior to surgery.  No smoking 24 hours prior to or 24 hours after surgery. 8. Wear loose pants or shorts- loose enough to fit over bandages, boots, and casts. 9. No slip on shoes, sneakers are best. 10. Bring  your boot with you to the surgery center/hospital.  Also bring crutches or a walker if your physician has prescribed it for you.  If you do not have this equipment, it will be provided for you after surgery. 11. If you have not been contracted by the surgery center/hospital by the day before your surgery, call to confirm the date and time of your surgery. 12. Leave-time from work may vary depending on the type of surgery you have.  Appropriate arrangements should be made prior to surgery with your employer. 13. Prescriptions will be provided immediately following surgery by your doctor.  Have these filled as soon as possible after surgery and take the medication as directed. 14. Remove nail polish on the operative foot. 15. Wash the night before surgery.  The night before surgery wash the foot and leg well with the antibacterial soap provided and water paying special attention to beneath the toenails and in between the toes.  Rinse thoroughly with water and dry well with a towel.  Perform this wash unless told not to do so by your physician.  Enclosed: 1 Ice pack (please put in freezer the night before surgery)   1 Hibiclens skin cleaner   Pre-op Instructions  If you have any questions regarding the instructions, do not hesitate to call our office.  [place]: [address] [place], [place] [address] [phone]  [place]: [address] [place], [place] [address] [phone]  [place]: [address]  [place], [place] [address] [phone]  Dr. Richard   Tuchman DPM, Dr. Norman Regal DPM Dr. Richard Sikora DPM, Dr. M. Todd Hyatt DPM, Dr. Kathryn Egerton DPM 

## 2014-08-06 NOTE — Progress Notes (Signed)
Subjective:     Patient ID: Joseph [name], male   DOB: 1950/08/18, 64 y.o.   MRN: [id number]  HPI patient presents stating I have these bad hammertoes on my right foot and they've mostly calm in the past year. I've had bad arthritis my big toe joints but they're not hurting me at the current time   Review of Systems  All other systems reviewed and are negative.      Objective:   Physical Exam  Constitutional: He is oriented to person, place, and time.  Cardiovascular: Intact distal pulses.   Musculoskeletal: Normal range of motion.  Neurological: He is oriented to person, place, and time.  Skin: Skin is warm.  Nursing note and vitals reviewed.  neurovascular status intact with muscle strength adequate and range of motion subtalar midtarsal joint within normal limits. Patient's noted to have significant elevation of the second toe right with rigid contracture moderate rigid contracture the third toe right and irritation with keratotic lesion fifth toe right with pain. 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 The big toe joints do not bother him so I recommend leaving them alone and I have recommended due to the rigid contracture and pain the patient experiences with any types shoes and failure to respond to shoe gear modification and padding that we consider digital fusion of the second and third toes along with arthroplasty digit 5 right. Patient wants procedures and at this time he needs to get it done as soon as possible due to his schedule and I allowed him to  read a consent form line byline for digital fusion digits 23 right and arthroplasty digits 5 right. Explained he will have pins in his toes for up to 5 weeks and that total recovery from this can take around 6 months but he should be ambulatory right away. Patient wants procedure understanding risk and signs consent form and is given all preoperative instructions. He is encouraged to call with any questions prior to procedure scheduled for next week

## 2014-08-08 HISTORY — PX: FOOT SURGERY: SHX648

## 2014-08-12 ENCOUNTER — Encounter: Payer: Self-pay | Admitting: Podiatry

## 2014-08-12 DIAGNOSIS — M2041 Other hammer toe(s) (acquired), right foot: Secondary | ICD-10-CM | POA: Diagnosis not present

## 2014-08-15 NOTE — Progress Notes (Signed)
DOS 04/502016 Fusion 2,3 rd toes right foot , arthroplasty 5th toe right foot.

## 2014-08-18 ENCOUNTER — Encounter: Payer: Self-pay | Admitting: Podiatry

## 2014-08-18 ENCOUNTER — Ambulatory Visit (INDEPENDENT_AMBULATORY_CARE_PROVIDER_SITE_OTHER): Payer: BLUE CROSS/BLUE SHIELD

## 2014-08-18 ENCOUNTER — Ambulatory Visit (INDEPENDENT_AMBULATORY_CARE_PROVIDER_SITE_OTHER): Payer: BLUE CROSS/BLUE SHIELD | Admitting: Podiatry

## 2014-08-18 VITALS — BP 136/80 | HR 77 | Resp 15

## 2014-08-18 DIAGNOSIS — Z9889 Other specified postprocedural states: Secondary | ICD-10-CM

## 2014-08-18 DIAGNOSIS — M2041 Other hammer toe(s) (acquired), right foot: Secondary | ICD-10-CM

## 2014-08-18 NOTE — Progress Notes (Signed)
Subjective:     Patient ID: Joseph SarnaJeffrey A Isaacson, male   DOB: 11/10/1950, 64 y.o.   MRN: [id number]  HPI patient states I'm doing well with the toes on my right foot and I'm ready to increase my activity   Review of Systems     Objective:   Physical Exam Neurovascular status intact muscle strength adequate with good alignment of the second third and fifth digits right with mild elevation of the second toe which we will use a splint to bring down. Wound edges are well coapted and pins are in place with a negative Homans sign noted    Assessment:     Doing well post fusion digits 23 right arthroplasty digits 5 right with the second digit being mildly elevated    Plan:     Reviewed condition and reviewed x-ray. At this time I placed him in a above ankle and digital splint in order to lower the second and third toes and explained to him how to do this in order to keep them from lifting. He will do this for the next 2 weeks and will be seen back for suture removal at that time or earlier if any issues should occur

## 2014-08-27 ENCOUNTER — Ambulatory Visit (INDEPENDENT_AMBULATORY_CARE_PROVIDER_SITE_OTHER): Payer: BLUE CROSS/BLUE SHIELD

## 2014-08-27 ENCOUNTER — Ambulatory Visit (INDEPENDENT_AMBULATORY_CARE_PROVIDER_SITE_OTHER): Payer: BLUE CROSS/BLUE SHIELD | Admitting: Podiatry

## 2014-08-27 DIAGNOSIS — G8918 Other acute postprocedural pain: Secondary | ICD-10-CM | POA: Diagnosis not present

## 2014-08-27 DIAGNOSIS — M2041 Other hammer toe(s) (acquired), right foot: Secondary | ICD-10-CM

## 2014-08-27 MED ORDER — CEPHALEXIN 500 MG PO CAPS: 500.0000 mg | ORAL_CAPSULE | Freq: Two times a day (BID) | ORAL | Status: DC

## 2014-08-27 NOTE — Progress Notes (Signed)
Subjective:     Patient ID: Joseph SarnaJeffrey A [name], male   DOB: [birth year], 64 y.o.   MRN: [id number]  HPI patient states I been having the second toe on my right foot and the pin I think has band and it's been sore and red   Review of Systems     Objective:   Physical Exam Neurovascular status intact with traumatized second toe right secondary to banging the toe and the pin with slight elevation of the second toe but overall a good clinical alignment. There is localized redness within the toe but no proximal edema erythema or drainage noted    Assessment:     , To the second toe right with probable bending of the pin and possible localized infection process secondary to trauma    Plan:     Remove the pin from the second and third toes and applied sterile dressings to hold them in place and placed patient on cephalexin 5 number milligrams twice a day and instructed if any increase in pain or drainage or any systemic signs of infection were to occur to let us know immediately or to go straight to the emergency room. I explained hopefully will be able to hold the toe in good position but will probably be slightly malaligned for the long-term

## 2014-08-29 ENCOUNTER — Telehealth: Payer: Self-pay | Admitting: *Deleted

## 2014-08-29 NOTE — Telephone Encounter (Signed)
Pt's wife, Revonda Standardllison states pt's toe is red, swollen and split at the suture line.  Dr. Ardelle AntonWagoner states have pt come in to the office today for evaluation.  Pt refused stating Dr. Charlsie Merlesegal removed the pin from the toe on 08/27/2014 and prescribed an antibiotic after evaluating the toe with the same symptoms.  I encouraged the pt to come in today, pt refused again, so I told pt to go to ER, if symptoms worsened or fever.  Pt agreed.

## 2014-09-01 ENCOUNTER — Ambulatory Visit (INDEPENDENT_AMBULATORY_CARE_PROVIDER_SITE_OTHER): Payer: BLUE CROSS/BLUE SHIELD | Admitting: Podiatry

## 2014-09-01 ENCOUNTER — Ambulatory Visit (INDEPENDENT_AMBULATORY_CARE_PROVIDER_SITE_OTHER): Payer: BLUE CROSS/BLUE SHIELD

## 2014-09-01 ENCOUNTER — Other Ambulatory Visit: Payer: Self-pay | Admitting: Family Medicine

## 2014-09-01 VITALS — BP 128/78 | HR 72 | Temp 98.5°F | Resp 15

## 2014-09-01 DIAGNOSIS — Z9889 Other specified postprocedural states: Secondary | ICD-10-CM

## 2014-09-02 NOTE — Progress Notes (Signed)
Subjective:     Patient ID: Joseph [name], male   DOB: 10-18-1950, 64 y.o.   MRN: [id number]  HPI patient states the redness doesn't seem as bad but the toe is still irritated second. I am not having pain   Review of Systems     Objective:   Physical Exam Neurovascular status intact muscle strength adequate with digits and good alignment and pins in place second and third toes. There is some redness in the dorsum of the second toe right but there is no drainage noted and there is no proximal edema erythema or drainage noted    Assessment:     Traumatized second toe with irritation but does not appear to be infective currently    Plan:     We took out several the stitches better leaving some in place and the second and third toes due to stress on the digits and we will continue antibiotics for 10 more days. Gave strict instructions of any changes should occur to let us know immediately and if not we'll take the stitches out 2 weeks and patient is to monitor this and continue with dressings on the second and third toes

## 2014-09-17 ENCOUNTER — Ambulatory Visit (INDEPENDENT_AMBULATORY_CARE_PROVIDER_SITE_OTHER): Payer: BLUE CROSS/BLUE SHIELD | Admitting: Podiatry

## 2014-09-17 ENCOUNTER — Ambulatory Visit (INDEPENDENT_AMBULATORY_CARE_PROVIDER_SITE_OTHER): Payer: BLUE CROSS/BLUE SHIELD

## 2014-09-17 ENCOUNTER — Encounter: Payer: Self-pay | Admitting: Podiatry

## 2014-09-17 VITALS — BP 123/77 | HR 74 | Resp 15

## 2014-09-17 DIAGNOSIS — Z9889 Other specified postprocedural states: Secondary | ICD-10-CM

## 2014-09-17 DIAGNOSIS — M2041 Other hammer toe(s) (acquired), right foot: Secondary | ICD-10-CM

## 2014-09-18 NOTE — Progress Notes (Signed)
Subjective:     Patient ID: Joseph SarnaJeffrey A [name], male   DOB: 03-07-1951, 64 y.o.   MRN: [id number]  HPI patient states my right second toe is doing okay but it still is swollen and I am happy with the position of my lesser digits   Review of Systems     Objective:   Physical Exam Neurovascular status intact with healing second third and fifth toe right with previous trauma to the second toe creating mild deformity and there is some keratotic lesion formation on the dorsum of the toe that is localized in nature and measures approximately 2 x 2 mm with no proximal edema erythema or drainage noted    Assessment:     Crusted tissue secondary to trauma second toe with no indication of current infection and digital deformities which are healing well right foot with some malalignment of the second toe secondary to previous injury    Plan:     Reviewed conditions and dispensed a ankle brace with a digital extension in order to lower the second toe and advised on soaks therapy and that if any redness drainage or pain should occur and the second toe to let us know immediately if not it should heal uneventfully. Continue to lower the second toe and reappoint in 4 weeks

## 2014-10-15 ENCOUNTER — Encounter: Payer: Self-pay | Admitting: Podiatry

## 2014-10-15 ENCOUNTER — Ambulatory Visit (INDEPENDENT_AMBULATORY_CARE_PROVIDER_SITE_OTHER): Payer: BLUE CROSS/BLUE SHIELD | Admitting: Podiatry

## 2014-10-15 ENCOUNTER — Ambulatory Visit (INDEPENDENT_AMBULATORY_CARE_PROVIDER_SITE_OTHER): Payer: BLUE CROSS/BLUE SHIELD

## 2014-10-15 VITALS — BP 136/81 | HR 65 | Resp 15

## 2014-10-15 DIAGNOSIS — Z9889 Other specified postprocedural states: Secondary | ICD-10-CM | POA: Diagnosis not present

## 2014-10-15 NOTE — Progress Notes (Signed)
Subjective:     Patient ID: Joseph SarnaJeffrey A [name], male   DOB: [birth year], 64 y.o.   MRN: [id number]  HPI patient presents stating my toes are doing well but I still gets some discomfort if him on them along time   Review of Systems     Objective:   Physical Exam Neurovascular status intact with well-healing surgical sites second third and fourth toes right with second toe having a couple deep stitches that are not giving him any problems currently with it noted that there is well healing of the dorsal side    Assessment:     Doing well after having digital surgery with trauma to the second toe which is caused some elevation    Plan:     Continue with conservative care and at this point return to shoe gear as tolerated. Reviewed x-rays and patient be seen back as needed

## 2015-01-12 ENCOUNTER — Encounter: Payer: Self-pay | Admitting: Family Medicine

## 2015-01-13 ENCOUNTER — Other Ambulatory Visit: Payer: Self-pay

## 2015-01-13 MED ORDER — LOSARTAN POTASSIUM 100 MG PO TABS: ORAL_TABLET | ORAL | Status: DC

## 2015-01-13 MED ORDER — VERAPAMIL HCL ER 240 MG PO TBCR: EXTENDED_RELEASE_TABLET | ORAL | Status: DC

## 2015-01-13 MED ORDER — ATORVASTATIN CALCIUM 40 MG PO TABS: ORAL_TABLET | ORAL | Status: DC

## 2015-01-13 MED ORDER — LISINOPRIL-HYDROCHLOROTHIAZIDE 20-12.5 MG PO TABS: 1.0000 | ORAL_TABLET | Freq: Every day | ORAL | Status: DC

## 2015-01-13 MED ORDER — FENOFIBRATE 160 MG PO TABS: ORAL_TABLET | ORAL | Status: DC

## 2015-01-21 ENCOUNTER — Other Ambulatory Visit: Payer: Self-pay | Admitting: Family Medicine

## 2015-02-12 ENCOUNTER — Other Ambulatory Visit (INDEPENDENT_AMBULATORY_CARE_PROVIDER_SITE_OTHER): Payer: BLUE CROSS/BLUE SHIELD

## 2015-02-12 DIAGNOSIS — Z0000 Encounter for general adult medical examination without abnormal findings: Secondary | ICD-10-CM

## 2015-02-12 LAB — LIPID PANEL
CHOLESTEROL: 232 mg/dL — AB (ref 0–200)
HDL: 56.6 mg/dL (ref >39.00)
LDL CALC: 155 mg/dL — AB (ref 0–99)
NonHDL: 175.07
TRIGLYCERIDES: 100 mg/dL (ref 0.0–149.0)
Total CHOL/HDL Ratio: 4
VLDL: 20 mg/dL (ref 0.0–40.0)

## 2015-02-12 LAB — CBC WITH DIFFERENTIAL/PLATELET
BASOS PCT: 0.4 % (ref 0.0–3.0)
Basophils Absolute: 0 10*3/uL (ref 0.0–0.1)
EOS PCT: 3.9 % (ref 0.0–5.0)
Eosinophils Absolute: 0.3 10*3/uL (ref 0.0–0.7)
HEMATOCRIT: 42.7 % (ref 39.0–52.0)
Hemoglobin: 14 g/dL (ref 13.0–17.0)
LYMPHS PCT: 28.4 % (ref 12.0–46.0)
Lymphs Abs: 2 10*3/uL (ref 0.7–4.0)
MCHC: 32.9 g/dL (ref 30.0–36.0)
MCV: 95.3 fl (ref 78.0–100.0)
MONOS PCT: 12.5 % — AB (ref 3.0–12.0)
Monocytes Absolute: 0.9 10*3/uL (ref 0.1–1.0)
Neutro Abs: 3.8 10*3/uL (ref 1.4–7.7)
Neutrophils Relative %: 54.8 % (ref 43.0–77.0)
Platelets: 202 10*3/uL (ref 150.0–400.0)
RBC: 4.48 Mil/uL (ref 4.22–5.81)
RDW: 13.9 % (ref 11.5–15.5)
WBC: 7 10*3/uL (ref 4.0–10.5)

## 2015-02-12 LAB — BASIC METABOLIC PANEL
BUN: 29 mg/dL — AB (ref 6–23)
CHLORIDE: 102 meq/L (ref 96–112)
CO2: 25 mEq/L (ref 19–32)
Calcium: 10 mg/dL (ref 8.4–10.5)
Creatinine, Ser: 1.45 mg/dL (ref 0.40–1.50)
GFR: 52.15 mL/min — AB (ref >60.00)
Glucose, Bld: 102 mg/dL — ABNORMAL HIGH (ref 70–99)
POTASSIUM: 4.3 meq/L (ref 3.5–5.1)
SODIUM: 138 meq/L (ref 135–145)

## 2015-02-12 LAB — HEPATIC FUNCTION PANEL
ALT: 20 U/L (ref 0–53)
AST: 20 U/L (ref 0–37)
Albumin: 4.4 g/dL (ref 3.5–5.2)
Alkaline Phosphatase: 32 U/L — ABNORMAL LOW (ref 39–117)
Bilirubin, Direct: 0.2 mg/dL (ref 0.0–0.3)
TOTAL PROTEIN: 6.9 g/dL (ref 6.0–8.3)
Total Bilirubin: 0.8 mg/dL (ref 0.2–1.2)

## 2015-02-12 LAB — TSH: TSH: 0.89 u[IU]/mL (ref 0.35–4.50)

## 2015-02-12 LAB — PSA: PSA: 0.99 ng/mL (ref 0.10–4.00)

## 2015-02-13 ENCOUNTER — Encounter: Payer: Self-pay | Admitting: Family Medicine

## 2015-02-16 ENCOUNTER — Ambulatory Visit (INDEPENDENT_AMBULATORY_CARE_PROVIDER_SITE_OTHER): Payer: BLUE CROSS/BLUE SHIELD | Admitting: Family Medicine

## 2015-02-16 ENCOUNTER — Encounter: Payer: Self-pay | Admitting: Family Medicine

## 2015-02-16 VITALS — BP 120/80 | HR 89 | Temp 98.4°F | Ht 72.0 in | Wt 250.9 lb

## 2015-02-16 DIAGNOSIS — Z0000 Encounter for general adult medical examination without abnormal findings: Secondary | ICD-10-CM | POA: Diagnosis not present

## 2015-02-16 NOTE — Progress Notes (Signed)
   Subjective:    Patient ID: Joseph [name], male    DOB: 08/05/1950, 64 y.o.   MRN: [id number]  HPI Patient seen for complete physical. He has history of obesity, hypertension, hyperlipidemia, transient atrial fibrillation and osteoarthritis. 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 He is looking at possible resurfacing procedure in [place]. He is scheduled for repeat colonoscopy on Monday. He walks for exercise. No recent chest pains or other complaints.  He declines flu vaccine. Tetanus up-to-date. Shingles vaccine up-to-date.  Past Medical History  Diagnosis Date  . Hyperlipidemia   . Hypertension   . Atrial fibrillation [facility])    Past Surgical History  Procedure Laterality Date  . Knee scopes  1992, 2003  . Total knee arthroplasty  2005    right     reports that he has never smoked. He does not have any smokeless tobacco history on file. His alcohol and drug histories are not on file. family history includes Cancer in his father and other; Heart disease in his father. No Known Allergies    Review of Systems  Constitutional: Negative for fever, activity change, appetite change and fatigue.  HENT: Negative for congestion, ear pain and trouble swallowing.   Eyes: Negative for pain and visual disturbance.  Respiratory: Negative for cough, shortness of breath and wheezing.   Cardiovascular: Negative for chest pain and palpitations.  Gastrointestinal: Negative for nausea, vomiting, abdominal pain, diarrhea, constipation, blood in stool, abdominal distention and rectal pain.  Genitourinary: Negative for dysuria, hematuria and testicular pain.  Musculoskeletal: Positive for arthralgias. Negative for joint swelling.  Skin: Negative for rash.  Neurological: Negative for dizziness, syncope and headaches.  Hematological: Negative for adenopathy.  Psychiatric/Behavioral: Negative for confusion  and dysphoric mood.       Objective:   Physical Exam  Constitutional: He is oriented to person, place, and time. He appears well-developed and well-nourished. No distress.  HENT:  Head: Normocephalic and atraumatic.  Right Ear: External ear normal.  Left Ear: External ear normal.  Mouth/Throat: Oropharynx is clear and moist.  Eyes: Conjunctivae and EOM are normal. Pupils are equal, round, and reactive to light.  Neck: Normal range of motion. Neck supple. No thyromegaly present.  Cardiovascular: Normal rate, regular rhythm and normal heart sounds.   No murmur heard. Pulmonary/Chest: No respiratory distress. He has no wheezes. He has no rales.  Abdominal: Soft. Bowel sounds are normal. He exhibits no distension and no mass. There is no tenderness. There is no rebound and no guarding.  Musculoskeletal: He exhibits no edema.  Lymphadenopathy:    He has no cervical adenopathy.  Neurological: He is alert and oriented to person, place, and time. He displays normal reflexes. No cranial nerve deficit.  Skin: No rash noted.  Psychiatric: He has a normal mood and affect.          Assessment & Plan:  Complete physical. Labs reviewed. Lipids are significantly elevated still (LDL 155). Increase Lipitor to 80 mg daily. He is encouraged to lose some weight. Blood sugar mildly elevated 102. Flu vaccine offered and declined. Repeat colonoscopy scheduled for Monday.

## 2015-02-16 NOTE — Progress Notes (Signed)
Pre visit review using our clinic review tool, if applicable. No additional management support is needed unless otherwise documented below in the visit note. 

## 2015-02-18 ENCOUNTER — Encounter: Payer: Self-pay | Admitting: Family Medicine

## 2015-02-23 LAB — HM COLONOSCOPY: HM Colonoscopy: NORMAL

## 2015-03-12 ENCOUNTER — Encounter: Payer: Self-pay | Admitting: Family Medicine

## 2015-03-17 ENCOUNTER — Other Ambulatory Visit: Payer: Self-pay

## 2015-03-17 ENCOUNTER — Other Ambulatory Visit: Payer: Self-pay | Admitting: Family Medicine

## 2015-03-17 DIAGNOSIS — Z01818 Encounter for other preprocedural examination: Secondary | ICD-10-CM

## 2015-03-18 ENCOUNTER — Encounter: Payer: Self-pay | Admitting: Family Medicine

## 2015-03-20 ENCOUNTER — Ambulatory Visit (HOSPITAL_BASED_OUTPATIENT_CLINIC_OR_DEPARTMENT_OTHER)
Admission: RE | Admit: 2015-03-20 | Discharge: 2015-03-20 | Disposition: A | Discharge status: Home / Self Care | Payer: BLUE CROSS/BLUE SHIELD | Source: Ambulatory Visit | Attending: Family Medicine | Admitting: Family Medicine

## 2015-03-20 ENCOUNTER — Other Ambulatory Visit (INDEPENDENT_AMBULATORY_CARE_PROVIDER_SITE_OTHER): Payer: BLUE CROSS/BLUE SHIELD

## 2015-03-20 DIAGNOSIS — Z01818 Encounter for other preprocedural examination: Secondary | ICD-10-CM

## 2015-03-20 DIAGNOSIS — I10 Essential (primary) hypertension: Secondary | ICD-10-CM | POA: Insufficient documentation

## 2015-03-20 DIAGNOSIS — J8410 Pulmonary fibrosis, unspecified: Secondary | ICD-10-CM | POA: Diagnosis not present

## 2015-03-20 LAB — POCT URINALYSIS DIPSTICK
BILIRUBIN UA: NEGATIVE
GLUCOSE UA: NEGATIVE
Ketones, UA: NEGATIVE
Leukocytes, UA: NEGATIVE
Nitrite, UA: NEGATIVE
Protein, UA: NEGATIVE
RBC UA: NEGATIVE
Urobilinogen, UA: 0.2
pH, UA: 6

## 2015-03-20 LAB — HEMOGLOBIN A1C
HEMOGLOBIN A1C: 5.5 % (ref <5.7)
MEAN PLASMA GLUCOSE: 111 mg/dL (ref <117)

## 2015-03-21 LAB — VITAMIN D 25 HYDROXY (VIT D DEFICIENCY, FRACTURES): VIT D 25 HYDROXY: 13 ng/mL — AB (ref 30–100)

## 2015-03-23 ENCOUNTER — Ambulatory Visit (INDEPENDENT_AMBULATORY_CARE_PROVIDER_SITE_OTHER): Payer: BLUE CROSS/BLUE SHIELD | Admitting: Family Medicine

## 2015-03-23 ENCOUNTER — Encounter: Payer: Self-pay | Admitting: Family Medicine

## 2015-03-23 VITALS — BP 116/88 | HR 98 | Temp 98.4°F | Resp 16 | Ht 72.0 in | Wt 254.0 lb

## 2015-03-23 DIAGNOSIS — I4891 Unspecified atrial fibrillation: Secondary | ICD-10-CM | POA: Diagnosis not present

## 2015-03-23 DIAGNOSIS — Z01818 Encounter for other preprocedural examination: Secondary | ICD-10-CM

## 2015-03-23 MED ORDER — ERGOCALCIFEROL 1.25 MG (50000 UT) PO CAPS: 50000.0000 [IU] | ORAL_CAPSULE | ORAL | Status: DC

## 2015-03-23 NOTE — Progress Notes (Signed)
   Subjective:    Patient ID: Joseph SarnaJeffrey A Hoog, male    DOB: [birth year], 64 y.o.   MRN: [id number]  HPI Patient here for preoperative clearance. He is being scheduled for resurfacing procedure of [place]. That surgeon is requesting several specific items including chest x-ray, EKG, bone density scan and lab work including vitamin D level. Recent vitamin D level 13.  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 Bone density scan normal. No other recent lab abnormalities.   Patient has past history of lone atrial fibrillation. He just had recent complete physical and was noted to be in regular rhythm at that time. He is here today for EKG which does show atrial fibrillation. He is asymptomatic.  Recent TSH normal. No history of valvular heart disease.  No history of heart failure, TIA, diabetes, or any other peripheral vascular disease or CAD history. He does have hypertension history which has been fairly well controlled No recent chest pain or exertional dyspnea.  Past Medical History  Diagnosis Date  . Hyperlipidemia   . Hypertension   . Atrial fibrillation [facility](HCC)    Past Surgical History  Procedure Laterality Date  . Knee scopes  1992, 2003  . Total knee arthroplasty  2005    right     reports that he has never smoked. He does not have any smokeless tobacco history on file. His alcohol and drug histories are not on file. family history includes Cancer in his father and other; Heart disease in his father. No Known Allergies      Review of Systems  Constitutional: Negative for fatigue.  Eyes: Negative for visual disturbance.  Respiratory: Negative for cough, chest tightness and shortness of breath.   Cardiovascular: Negative for chest pain, palpitations and leg swelling.  Neurological: Negative for dizziness, syncope, weakness, light-headedness and headaches.       Objective:   Physical Exam  Constitutional: He is oriented to  person, place, and time. He appears well-developed and well-nourished.  HENT:  Right Ear: External ear normal.  Left Ear: External ear normal.  Mouth/Throat: Oropharynx is clear and moist.  Eyes: Pupils are equal, round, and reactive to light.  Neck: Neck supple. No thyromegaly present.  Cardiovascular: Normal rate.   Irregularly regular rhythm rate control  Pulmonary/Chest: Effort normal and breath sounds normal. No respiratory distress. He has no wheezes. He has no rales.  Musculoskeletal: He exhibits no edema.  Neurological: He is alert and oriented to person, place, and time.          Assessment & Plan:  #1 atrial fibrillation rate controlled. This is confirmed by EKG.CHA2DS2VASC score of 1.  Will recommend cardiology follow-up. Continue daily aspirin.  Pt on Verapamil which seems to be controlling his rate. #2 hypertension which is well-controlled and stable #3 low vitamin D. Start vitamin D 50,000 international units 1 weekly and recheck vitamin D level in 3 months

## 2015-03-23 NOTE — Patient Instructions (Signed)

## 2015-03-25 ENCOUNTER — Encounter: Payer: Self-pay | Admitting: Family Medicine

## 2015-03-27 ENCOUNTER — Encounter: Payer: Self-pay | Admitting: Cardiovascular Disease

## 2015-03-27 ENCOUNTER — Ambulatory Visit (INDEPENDENT_AMBULATORY_CARE_PROVIDER_SITE_OTHER): Payer: BLUE CROSS/BLUE SHIELD | Admitting: Cardiovascular Disease

## 2015-03-27 VITALS — BP 150/100 | HR 84 | Ht 72.0 in | Wt 252.2 lb

## 2015-03-27 DIAGNOSIS — I480 Paroxysmal atrial fibrillation: Secondary | ICD-10-CM

## 2015-03-27 NOTE — Patient Instructions (Signed)
Medication Instructions:  Your physician recommends that you continue on your current medications as directed. Please refer to the Current Medication list given to you today.   Labwork: None Ordered   Testing/Procedures: Your physician has requested that you have an echocardiogram. Echocardiography is a painless test that uses sound waves to create images of your heart. It provides your doctor with information about the size and shape of your heart and how well your heart's chambers and valves are working. This procedure takes approximately one hour. There are no restrictions for this procedure.   Follow-Up: Your physician wants you to follow-up in: 1 year with Dr. Nahser. You will receive a reminder letter in the mail two months in advance. If you don't receive a letter, please call our office to schedule the follow-up appointment.   If you need a refill on your cardiac medications before your next appointment, please call your pharmacy.   Thank you for choosing [facility]! Darshay Deupree, RN [phone]    

## 2015-03-27 NOTE — Progress Notes (Signed)
Cardiology Office Note   Date:  03/27/2015   ID:  Joseph [name], DOB December 23, 1950, MRN [id number]  PCP:  Joseph Covey, MD  Cardiologist:   Joseph Mixer, MD   Chief Complaint  Patient presents with  . Atrial Fibrillation   Problem list 1. Atrial fibrillation 2. Essential hypertension  3. Hyperlipidemia  History of Present Illness: Joseph [name] is a 64 y.o. male who presents for evaluation of his atrial fibrillation. Has had a-fib in the past.  Was noticed on an ECG done for pre-op visit for hip surgery .  Going for hip resurfacing ( easier than hip replacement )  Was diagnosced with PAF by Dr. Graciela [name] about 15 years ago,. Has had an echo in the past (report was not found)  Has been fairly active - now limited by his hip pain .  Previously owed Joseph [name].   Sold it to Joseph [name] last year.   He had foot surgery this past April without problems.     Past Medical History  Diagnosis Date  . Hyperlipidemia   . Hypertension   . Atrial fibrillation [facility])     Past Surgical History  Procedure Laterality Date  . Knee scopes  1992, 2003  . Total knee arthroplasty  2005    right      Current Outpatient Prescriptions  Medication Sig Dispense Refill  . aspirin 81 MG tablet Take 81 mg by mouth 2 (two) times daily.     Marland Kitchen atorvastatin (LIPITOR) 80 MG tablet Take 80 mg by mouth daily.    . ergocalciferol (VITAMIN D2) 50000 UNITS capsule Take 1 capsule (50,000 Units total) by mouth once a week. 12 capsule 3  . fenofibrate 160 MG tablet TAKE 1 TABLET (160 MG TOTAL) BY MOUTH DAILY. 90 tablet 0  . lisinopril-hydrochlorothiazide (PRINZIDE,ZESTORETIC) 20-12.5 MG per tablet Take 1 tablet by mouth daily. 90 tablet 0  . losartan (COZAAR) 100 MG tablet TAKE 1 TABLET (100 MG TOTAL) BY MOUTH DAILY. 90 tablet 0  . mupirocin ointment (BACTROBAN) 2 % Apply 1 application topically 2 (two) times daily.     . tamsulosin (FLOMAX) 0.4 MG CAPS capsule Take 0.4 mg by  mouth once.     . verapamil (CALAN-SR) 240 MG CR tablet TAKE 1 TABLET (240 MG TOTAL) BY MOUTH AT BEDTIME. 90 tablet 0   No current facility-administered medications for this visit.    Allergies:   Review of patient's allergies indicates no known allergies.    Social History:  The patient  reports that he has never smoked. He does not have any smokeless tobacco history on file.   Family History:  The patient's family history includes Cancer in his father and other; Heart disease in his father.    ROS:  Please [name] the history of present illness.    Review of Systems: Constitutional:  denies fever, chills, diaphoresis, appetite change and fatigue.  HEENT: denies photophobia, eye pain, redness, hearing loss, ear pain, congestion, sore throat, rhinorrhea, sneezing, neck pain, neck stiffness and tinnitus.  Respiratory: denies SOB, DOE, cough, chest tightness, and wheezing.  Cardiovascular: denies chest pain, palpitations and leg swelling.  Gastrointestinal: denies nausea, vomiting, abdominal pain, diarrhea, constipation, blood in stool.  Genitourinary: denies dysuria, urgency, frequency, hematuria, flank pain and difficulty urinating.  Musculoskeletal: denies  myalgias, back pain, joint swelling, arthralgias and gait problem.   Skin: denies pallor, rash and wound.  Neurological: denies dizziness, seizures, syncope, weakness, light-headedness, numbness and headaches.   Hematological:  denies adenopathy, easy bruising, personal or family bleeding history.  Psychiatric/ Behavioral: denies suicidal ideation, mood changes, confusion, nervousness, sleep disturbance and agitation.       All other systems are reviewed and negative.    PHYSICAL EXAM: VS:  BP 150/100 mmHg  Pulse 84  Ht 6' (1.829 m)  Wt 252 lb 3.2 oz (114.397 kg)  BMI 34.20 kg/m2  SpO2 97% , BMI Body mass index is 34.2 kg/(m^2). GEN: Well nourished, well developed, in no acute distress HEENT: normal Neck: no JVD, carotid  bruits, or masses Cardiac: ; no murmurs, rubs, or gallops,no edema  Respiratory:  clear to auscultation bilaterally, normal work of breathing GI: soft, nontender, nondistended, + BS MS: no deformity or atrophy Skin: warm and dry, no rash Neuro:  Strength and sensation are intact Psych: normal   EKG:  EKG is not ordered today. The ekg ordered 03/23/15  demonstrates atrial fib with controlled V response.    Recent Labs: 02/12/2015: ALT 20; BUN 29*; Creatinine, Ser 1.45; Hemoglobin 14.0; Platelets 202.0; Potassium 4.3; Sodium 138; TSH 0.89    Lipid Panel    Component Value Date/Time   CHOL 232* 02/12/2015 0847   TRIG 100.0 02/12/2015 0847   HDL 56.60 02/12/2015 0847   CHOLHDL 4 02/12/2015 0847   VLDL 20.0 02/12/2015 0847   LDLCALC 155* 02/12/2015 0847   LDLDIRECT 146.9 03/05/2014 0944      Wt Readings from Last 3 Encounters:  03/27/15 252 lb 3.2 oz (114.397 kg)  03/23/15 254 lb (115.214 kg)  02/16/15 250 lb 14.4 oz (113.807 kg)      Other studies Reviewed: Additional studies/ records that were reviewed today include: . Review of the above records demonstrates:    ASSESSMENT AND PLAN:  1.  paroxysmal atrial fibrillation.  Joseph [name] has had paroxysmal atrial fibrillation for last 15 years or so. He's had an echocardiogram but the results are not available in our current computer system.  He's completely asymptomatic. He denies any chest pain or shortness of breath. He denies any syncope or presyncope. He cannot tell when his heart rate is regular or irregular. His rate has been well controlled on verapamil.  He needs to have preoperative evaluation prior to hip surgery. We'll get an echocardiogram for further evaluation of his heart function. I will anticipate seeing him again in one year-sooner if needed.     urrent medicines are reviewed at length with the patient today.  The patient does not have concerns regarding medicines.  The following changes have been made:  no  change  Labs/ tests ordered today include:  No orders of the defined types were placed in this encounter.     Disposition:   FU with me in 1 year.      Joseph [name], Joseph PingPhilip J, MD  03/27/2015 9:12 AM    [facility] [address], [place], [place]  [address] Phone: [phone]; Fax: [phone]   [facility]  [address] [place], [place]  [address] [phone]   Fax [phone]

## 2015-04-01 ENCOUNTER — Ambulatory Visit (HOSPITAL_COMMUNITY): Payer: BLUE CROSS/BLUE SHIELD | Attending: Cardiovascular Disease

## 2015-04-01 ENCOUNTER — Encounter: Payer: Self-pay | Admitting: Family Medicine

## 2015-04-01 ENCOUNTER — Other Ambulatory Visit: Payer: Self-pay

## 2015-04-01 DIAGNOSIS — E669 Obesity, unspecified: Secondary | ICD-10-CM | POA: Insufficient documentation

## 2015-04-01 DIAGNOSIS — I480 Paroxysmal atrial fibrillation: Secondary | ICD-10-CM | POA: Diagnosis not present

## 2015-04-01 DIAGNOSIS — E785 Hyperlipidemia, unspecified: Secondary | ICD-10-CM | POA: Insufficient documentation

## 2015-04-01 DIAGNOSIS — I4891 Unspecified atrial fibrillation: Secondary | ICD-10-CM | POA: Diagnosis not present

## 2015-04-01 DIAGNOSIS — Z6833 Body mass index (BMI) 33.0-33.9, adult: Secondary | ICD-10-CM | POA: Insufficient documentation

## 2015-04-01 DIAGNOSIS — I517 Cardiomegaly: Secondary | ICD-10-CM | POA: Insufficient documentation

## 2015-04-01 DIAGNOSIS — I10 Essential (primary) hypertension: Secondary | ICD-10-CM | POA: Diagnosis not present

## 2015-04-06 ENCOUNTER — Other Ambulatory Visit: Payer: Self-pay | Admitting: Family Medicine

## 2015-04-08 ENCOUNTER — Telehealth: Payer: Self-pay | Admitting: Cardiovascular Disease

## 2015-04-08 ENCOUNTER — Encounter: Payer: Self-pay | Admitting: *Deleted

## 2015-04-08 ENCOUNTER — Telehealth: Payer: Self-pay | Admitting: Family Medicine

## 2015-04-08 ENCOUNTER — Encounter: Payer: Self-pay | Admitting: Family Medicine

## 2015-04-08 ENCOUNTER — Other Ambulatory Visit: Payer: BLUE CROSS/BLUE SHIELD

## 2015-04-08 DIAGNOSIS — Z01818 Encounter for other preprocedural examination: Secondary | ICD-10-CM

## 2015-04-08 NOTE — Telephone Encounter (Signed)
Okay for order?

## 2015-04-08 NOTE — Telephone Encounter (Signed)
Spoke with patient and [facility] I have scheduled him an appt and put the CMP lab in the system just incase it he needs to have it.

## 2015-04-08 NOTE — Telephone Encounter (Signed)
Pt call to say that one of labs was not done and he is having surgery on Friday.. Pt said he needs a CMP done today. Can a order be placed for this please

## 2015-04-08 NOTE — Telephone Encounter (Signed)
New message     Pt is having hip surgery in [place] on Friday.  They have not received the clearance form from us.  Did we get it and have we faxed it back?

## 2015-04-08 NOTE — Telephone Encounter (Signed)
Yes-but, a CMP is essentially the same as a BMP + Hepatic and he had both of those in October.

## 2015-04-08 NOTE — Telephone Encounter (Signed)
Called and informed patient that we didn't receive the clearance form.  I informed him that Dr Elease HashimotoNahser is writing a clearance letter that I will fax, before noon today, to [facility],  [place] [phone], Attn: Bonita QuinLinda.

## 2015-04-09 ENCOUNTER — Telehealth: Payer: Self-pay | Admitting: Cardiovascular Disease

## 2015-04-09 NOTE — Telephone Encounter (Signed)
Spoke with Judeth CornfieldStephanie at [facility] [place] . She states that pt [facility] has been cleared for surgery per Dr. Elease HashimotoNahser . The surgeon had received an EKG performed on 03/23/15 by Dr. Caryl NeverBurchette where it shows that pt is in A-fib rate of 112 beats/minute. The anesthesiologist would like to know if Dr Elease HashimotoNahser is aware that  Pt is in A-fib. If so MD needs to add a note to the surgical clearance stating that MD is aware. Dr. Elease HashimotoNahser is aware. MD added to clearance note that "Pt has stable A-fib" and signed. The surgical clearance letter and the last pt 03/27/15 office visit faxed ATT : [place] at Fax #[phone]

## 2015-04-09 NOTE — Telephone Encounter (Signed)
New message     Pt is driving to [place] today to have hip replacement surgery tomorrow.  They received clearance from us. However, they received an EKG dated 03-23-15 from Dr Caryl NeverBurchette indicating patient's heart rate was 112.  The anesthesiologist says pt is in afib and want to make sure Dr Elease HashimotoNahser is aware.  Please call asap.  If pt is not to have surgery, they will try to get in touch with him [facility]

## 2015-04-10 ENCOUNTER — Other Ambulatory Visit: Payer: Self-pay | Admitting: Family Medicine

## 2015-04-10 MED ORDER — FENOFIBRATE 160 MG PO TABS: ORAL_TABLET | ORAL | Status: DC

## 2015-04-10 MED ORDER — TAMSULOSIN HCL 0.4 MG PO CAPS: 0.4000 mg | ORAL_CAPSULE | Freq: Once | ORAL | Status: DC

## 2015-04-10 MED ORDER — VERAPAMIL HCL ER 240 MG PO TBCR: EXTENDED_RELEASE_TABLET | ORAL | Status: DC

## 2015-04-10 MED ORDER — TRIAMCINOLONE ACETONIDE 0.1 % EX CREA: TOPICAL_CREAM | Freq: Two times a day (BID) | CUTANEOUS | Status: DC

## 2015-04-10 MED ORDER — LISINOPRIL-HYDROCHLOROTHIAZIDE 20-12.5 MG PO TABS: 1.0000 | ORAL_TABLET | Freq: Every day | ORAL | Status: DC

## 2015-04-10 MED ORDER — ERGOCALCIFEROL 1.25 MG (50000 UT) PO CAPS: 50000.0000 [IU] | ORAL_CAPSULE | ORAL | Status: DC

## 2015-04-10 MED ORDER — LOSARTAN POTASSIUM 100 MG PO TABS: ORAL_TABLET | ORAL | Status: DC

## 2015-06-03 ENCOUNTER — Ambulatory Visit (HOSPITAL_BASED_OUTPATIENT_CLINIC_OR_DEPARTMENT_OTHER)
Admission: RE | Admit: 2015-06-03 | Discharge: 2015-06-03 | Disposition: A | Discharge status: Home / Self Care | Payer: BLUE CROSS/BLUE SHIELD | Source: Ambulatory Visit | Attending: Family Medicine | Admitting: Family Medicine

## 2015-06-03 ENCOUNTER — Other Ambulatory Visit: Payer: Self-pay | Admitting: Family Medicine

## 2015-06-03 DIAGNOSIS — M1611 Unilateral primary osteoarthritis, right hip: Secondary | ICD-10-CM

## 2015-06-03 DIAGNOSIS — M47896 Other spondylosis, lumbar region: Secondary | ICD-10-CM | POA: Diagnosis not present

## 2015-06-03 DIAGNOSIS — Z9889 Other specified postprocedural states: Secondary | ICD-10-CM | POA: Insufficient documentation

## 2015-06-09 ENCOUNTER — Other Ambulatory Visit: Payer: Self-pay | Admitting: Family Medicine

## 2015-06-11 ENCOUNTER — Telehealth: Payer: Self-pay

## 2015-06-11 NOTE — Telephone Encounter (Signed)
This has been compounded in past with moisturizer such as Eucerin.  I suggest that he just get an OTC moisturizer and combine in his own in about 1:1 ratio- since they will not cover. OK to refill the kenalog cream.

## 2015-06-11 NOTE — Telephone Encounter (Signed)
Pt pharmacy request a change in the triamcinolone cream (KENALOG) 0.1 %. Pt insurance will not pay for compounds and it's expensive. Pharmacy would like for you to write a Rx for 2 ingredients individually.   Please advise     .

## 2015-06-15 NOTE — Telephone Encounter (Signed)
Message was left for pt to return my call

## 2015-06-16 ENCOUNTER — Encounter: Payer: Self-pay | Admitting: Family Medicine

## 2015-06-17 MED ORDER — TRIAMCINOLONE ACETONIDE 0.1 % EX CREA: TOPICAL_CREAM | Freq: Two times a day (BID) | CUTANEOUS | Status: DC

## 2016-03-07 ENCOUNTER — Other Ambulatory Visit (INDEPENDENT_AMBULATORY_CARE_PROVIDER_SITE_OTHER): Payer: BLUE CROSS/BLUE SHIELD

## 2016-03-07 DIAGNOSIS — Z0000 Encounter for general adult medical examination without abnormal findings: Secondary | ICD-10-CM

## 2016-03-07 LAB — LIPID PANEL
Cholesterol: 220 mg/dL — ABNORMAL HIGH (ref 0–200)
HDL: 58.3 mg/dL (ref >39.00)
LDL Cholesterol: 143 mg/dL — ABNORMAL HIGH (ref 0–99)
NonHDL: 162.18
TRIGLYCERIDES: 94 mg/dL (ref 0.0–149.0)
Total CHOL/HDL Ratio: 4
VLDL: 18.8 mg/dL (ref 0.0–40.0)

## 2016-03-07 LAB — HEPATIC FUNCTION PANEL
ALBUMIN: 4.4 g/dL (ref 3.5–5.2)
ALT: 19 U/L (ref 0–53)
AST: 23 U/L (ref 0–37)
Alkaline Phosphatase: 32 U/L — ABNORMAL LOW (ref 39–117)
BILIRUBIN DIRECT: 0.3 mg/dL (ref 0.0–0.3)
TOTAL PROTEIN: 6.8 g/dL (ref 6.0–8.3)
Total Bilirubin: 0.9 mg/dL (ref 0.2–1.2)

## 2016-03-07 LAB — BASIC METABOLIC PANEL
BUN: 31 mg/dL — AB (ref 6–23)
CALCIUM: 10.2 mg/dL (ref 8.4–10.5)
CHLORIDE: 107 meq/L (ref 96–112)
CO2: 28 meq/L (ref 19–32)
CREATININE: 1.48 mg/dL (ref 0.40–1.50)
GFR: 50.76 mL/min — ABNORMAL LOW (ref >60.00)
GLUCOSE: 98 mg/dL (ref 70–99)
Potassium: 4.3 mEq/L (ref 3.5–5.1)
Sodium: 145 mEq/L (ref 135–145)

## 2016-03-07 LAB — CBC WITH DIFFERENTIAL/PLATELET
BASOS PCT: 0.5 % (ref 0.0–3.0)
Basophils Absolute: 0 10*3/uL (ref 0.0–0.1)
EOS ABS: 0.1 10*3/uL (ref 0.0–0.7)
Eosinophils Relative: 1.8 % (ref 0.0–5.0)
HEMATOCRIT: 44.7 % (ref 39.0–52.0)
Hemoglobin: 14.9 g/dL (ref 13.0–17.0)
LYMPHS ABS: 1.5 10*3/uL (ref 0.7–4.0)
LYMPHS PCT: 19.3 % (ref 12.0–46.0)
MCHC: 33.2 g/dL (ref 30.0–36.0)
MCV: 98.4 fl (ref 78.0–100.0)
Monocytes Absolute: 0.8 10*3/uL (ref 0.1–1.0)
Monocytes Relative: 10.4 % (ref 3.0–12.0)
NEUTROS ABS: 5.2 10*3/uL (ref 1.4–7.7)
NEUTROS PCT: 68 % (ref 43.0–77.0)
PLATELETS: 174 10*3/uL (ref 150.0–400.0)
RBC: 4.54 Mil/uL (ref 4.22–5.81)
RDW: 14.6 % (ref 11.5–15.5)
WBC: 7.6 10*3/uL (ref 4.0–10.5)

## 2016-03-07 LAB — PSA: PSA: 1.12 ng/mL (ref 0.10–4.00)

## 2016-03-07 LAB — TSH: TSH: 0.96 u[IU]/mL (ref 0.35–4.50)

## 2016-03-14 ENCOUNTER — Ambulatory Visit (INDEPENDENT_AMBULATORY_CARE_PROVIDER_SITE_OTHER): Payer: BLUE CROSS/BLUE SHIELD | Admitting: Family Medicine

## 2016-03-14 ENCOUNTER — Encounter: Payer: Self-pay | Admitting: Family Medicine

## 2016-03-14 VITALS — BP 138/86 | HR 91 | Temp 98.4°F | Ht 72.0 in | Wt 256.9 lb

## 2016-03-14 DIAGNOSIS — Z0000 Encounter for general adult medical examination without abnormal findings: Secondary | ICD-10-CM

## 2016-03-14 NOTE — Progress Notes (Signed)
Pre visit review using our clinic review tool, if applicable. No additional management support is needed unless otherwise documented below in the visit note. 

## 2016-03-14 NOTE — Patient Instructions (Signed)
Set up follow up with Dr Elease HashimotoNahser within the next couple of months. Monitor blood pressure and be in touch if consistently > 140/90.

## 2016-03-14 NOTE — Progress Notes (Signed)
Subjective:     Patient ID: Joseph [name], male   DOB: 08-06-1950, 65 y.o.   MRN: [id number]  HPI Patient seen for physical exam. His chronic problems include history of obesity, hyperlipidemia, hypertension, history of paroxysmal atrial fibrillation. He was diagnosed with atrial fibrillation about 15 years ago. He saw cardiologist last year. He remains on aspirin. Other medications include Lipitor, fenofibrate, lisinopril HCTZ, losartan, verapamil, and Lipitor. No recent chest pains. No dizziness. No palpitations. Declines flu vaccination. Tetanus up-to-date. Colonoscopy up-to-date. No specific risk factors for hepatitis C. He declines screening today.  Past Medical History:  Diagnosis Date  . Atrial fibrillation (HCC)   . Hyperlipidemia   . Hypertension    Past Surgical History:  Procedure Laterality Date  . FOOT SURGERY Right 08/08/2014  . knee scopes  1992, 2003  . right hip replacement Right   . TOTAL KNEE ARTHROPLASTY  2005   right     reports that he has never smoked. He has never used smokeless tobacco. He reports that he drinks alcohol. He reports that he does not use drugs. family history includes Cancer in his father and other; Heart disease in his father. No Known Allergies   Review of Systems  Constitutional: Negative for activity change, appetite change, fatigue and fever.  HENT: Negative for congestion, ear pain and trouble swallowing.   Eyes: Negative for pain and visual disturbance.  Respiratory: Negative for cough, shortness of breath and wheezing.   Cardiovascular: Negative for chest pain and palpitations.  Gastrointestinal: Negative for abdominal distention, abdominal pain, blood in stool, constipation, diarrhea, nausea, rectal pain and vomiting.  Endocrine: Negative for polydipsia and polyuria.  Genitourinary: Negative for dysuria, hematuria and testicular pain.  Musculoskeletal: Negative for arthralgias and joint swelling.  Skin: Negative for rash.   Neurological: Negative for dizziness, syncope and headaches.  Hematological: Negative for adenopathy.  Psychiatric/Behavioral: Negative for confusion and dysphoric mood.       Objective:   Physical Exam  Constitutional: He is oriented to person, place, and time. He appears well-developed and well-nourished. No distress.  HENT:  Head: Normocephalic and atraumatic.  Right Ear: External ear normal.  Left Ear: External ear normal.  Mouth/Throat: Oropharynx is clear and moist.  Eyes: Conjunctivae and EOM are normal. Pupils are equal, round, and reactive to light.  Neck: Normal range of motion. Neck supple. No thyromegaly present.  Cardiovascular: Normal rate and normal heart sounds.   No murmur heard. Irregularly irregular rhythm rate controlled  Pulmonary/Chest: No respiratory distress. He has no wheezes. He has no rales.  Abdominal: Soft. Bowel sounds are normal. He exhibits no distension and no mass. There is no tenderness. There is no rebound and no guarding.  Musculoskeletal: He exhibits no edema.  Lymphadenopathy:    He has no cervical adenopathy.  Neurological: He is alert and oriented to person, place, and time. He displays normal reflexes. No cranial nerve deficit.  Skin: No rash noted.  Psychiatric: He has a normal mood and affect.       Assessment:     Physical exam. Patient appears to be back in A. fib at this time.  He has CHA2DS-Vasc score of 1 (hypertension) and this would go to 2 at age 65.  He is sometimes aware of irregularity but for the most part was not aware of irregular rhythm today.    Plan:     -Continue aspirin one daily -He is encouraged to schedule follow-up with cardiology within the next 1-2 months. -Continue  regular walking and exercise efforts and try to lose some weight -Flu vaccine recommended and declined -Discussed hepatitis C screening and he declines  Joseph CoveyBruce W Samaad Hashem MD [facility]

## 2016-03-23 ENCOUNTER — Other Ambulatory Visit: Payer: Self-pay | Admitting: Family Medicine

## 2016-03-25 ENCOUNTER — Other Ambulatory Visit: Payer: Self-pay

## 2016-03-25 ENCOUNTER — Other Ambulatory Visit: Payer: Self-pay | Admitting: Family Medicine

## 2016-04-13 ENCOUNTER — Encounter: Payer: Self-pay | Admitting: Cardiovascular Disease

## 2016-04-15 ENCOUNTER — Encounter: Payer: Self-pay | Admitting: Family Medicine

## 2016-04-15 ENCOUNTER — Ambulatory Visit (INDEPENDENT_AMBULATORY_CARE_PROVIDER_SITE_OTHER): Payer: BLUE CROSS/BLUE SHIELD | Admitting: Family Medicine

## 2016-04-15 VITALS — BP 100/70 | HR 114 | Temp 98.6°F | Ht 72.0 in | Wt 256.0 lb

## 2016-04-15 DIAGNOSIS — J209 Acute bronchitis, unspecified: Secondary | ICD-10-CM | POA: Diagnosis not present

## 2016-04-15 MED ORDER — AZITHROMYCIN 250 MG PO TABS: ORAL_TABLET | ORAL | 0 refills | Status: DC

## 2016-04-15 NOTE — Progress Notes (Signed)
Pre visit review using our clinic review tool, if applicable. No additional management support is needed unless otherwise documented below in the visit note. 

## 2016-04-15 NOTE — Patient Instructions (Signed)

## 2016-04-15 NOTE — Progress Notes (Signed)
Subjective:     Patient ID: Joseph [name], male   DOB: 1950-06-20, 65 y.o.   MRN: [id number]  HPI Patient seen with 2 week history of cough. Nonsmoker. He started off with cold-like symptoms with sore throat and some nasal congestion. He's not had any fever. No dyspnea. No wheezing. No sinusitis symptoms. Denies any sick contacts. Cough is productive of thick green-yellow sputum especially early in the mornings.  Past Medical History:  Diagnosis Date  . Atrial fibrillation (HCC)   . Hyperlipidemia   . Hypertension    Past Surgical History:  Procedure Laterality Date  . FOOT SURGERY Right 08/08/2014  . knee scopes  1992, 2003  . right hip replacement Right   . TOTAL KNEE ARTHROPLASTY  2005   right     reports that he has never smoked. He has never used smokeless tobacco. He reports that he drinks alcohol. He reports that he does not use drugs. family history includes Cancer in his father and other; Heart disease in his father. No Known Allergies   Review of Systems  Constitutional: Negative for activity change, appetite change, chills, fatigue, fever and unexpected weight change.  HENT: Negative for congestion, ear pain, sore throat and trouble swallowing.   Respiratory: Positive for cough. Negative for shortness of breath, wheezing and stridor.   Cardiovascular: Negative for chest pain and leg swelling.  Gastrointestinal: Negative for abdominal pain.  Musculoskeletal: Negative for arthralgias.  Skin: Negative for rash.  Neurological: Negative for syncope and headaches.  Hematological: Negative for adenopathy.       Objective:   Physical Exam  Constitutional: He is oriented to person, place, and time. He appears well-developed and well-nourished. No distress.  HENT:  Head: Normocephalic and atraumatic.  Right Ear: External ear normal.  Left Ear: External ear normal.  Mouth/Throat: Oropharynx is clear and moist.  Eyes: Pupils are equal, round, and reactive to light.   Neck: Normal range of motion. Neck supple. No thyromegaly present.  Cardiovascular: Normal rate and regular rhythm.   No murmur heard. Pulmonary/Chest: Effort normal. No stridor. No respiratory distress. He has no wheezes. He has no rales.  Abdominal: Soft. Bowel sounds are normal. There is no tenderness.  Musculoskeletal: He exhibits no edema.  Lymphadenopathy:    He has no cervical adenopathy.  Neurological: He is alert and oriented to person, place, and time.  Psychiatric: He has a normal mood and affect.       Assessment:     Cough. Suspect acute bronchitis.    Plan:     -We recommend observation at this point. Start antibiotic with Zithromax if he develops any fever or worsening symptoms. -Otherwise treat symptomatically with over-the-counter medications  Joseph CoveyBruce W Chanel Mcadams MD [facility]

## 2016-04-18 ENCOUNTER — Encounter: Payer: Self-pay | Admitting: Cardiovascular Disease

## 2016-04-18 ENCOUNTER — Ambulatory Visit (INDEPENDENT_AMBULATORY_CARE_PROVIDER_SITE_OTHER): Payer: BLUE CROSS/BLUE SHIELD | Admitting: Cardiovascular Disease

## 2016-04-18 VITALS — BP 112/90 | HR 105 | Ht 72.0 in | Wt 258.0 lb

## 2016-04-18 DIAGNOSIS — I482 Chronic atrial fibrillation, unspecified: Secondary | ICD-10-CM

## 2016-04-18 DIAGNOSIS — I10 Essential (primary) hypertension: Secondary | ICD-10-CM | POA: Diagnosis not present

## 2016-04-18 MED ORDER — METOPROLOL TARTRATE 50 MG PO TABS: 50.0000 mg | ORAL_TABLET | Freq: Two times a day (BID) | ORAL | 11 refills | Status: DC

## 2016-04-18 MED ORDER — HYDROCHLOROTHIAZIDE 25 MG PO TABS: 12.5000 mg | ORAL_TABLET | Freq: Every day | ORAL | 3 refills | Status: DC

## 2016-04-18 NOTE — Patient Instructions (Signed)
Medication Instructions:  STOP Verapamil STOP Lisinopril/HCTZ START Hydrochlorothiazide (HCTZ) 12.5 mg once daily START Metoprolol (Lopressor) 50 mg twice daily   Labwork: Your physician recommends that you return for lab work on Friday Dec. 22 at 9:00   Testing/Procedures: None Ordered   Follow-Up: Your physician recommends that you return for BP check/Nurse visit/Lab appointment on Friday Dec. 22 @ 9:00 am  Your physician recommends that you schedule a follow-up appointment in: 3 months with Dr. Elease HashimotoNahser.   If you need a refill on your cardiac medications before your next appointment, please call your pharmacy.   Thank you for choosing [facility]! Eligha BridegroomMichelle Swinyer, RN [phone]

## 2016-04-18 NOTE — Progress Notes (Signed)
Cardiology Office Note   Date:  04/18/2016   ID:  [name], DOB 08/16/1950, MRN [id number]  PCP:  Kristian Covey, MD  Cardiologist:   Kristeen Miss, MD   Chief Complaint  Patient presents with  . Follow-up    atrial fib   Problem list 1. Atrial fibrillation 2. Essential hypertension  3. Hyperlipidemia  History of Present Illness: Joseph [name] is a 65 y.o. male who presents for evaluation of his atrial fibrillation. Has had a-fib in the past.  Was noticed on an ECG done for pre-op visit for hip surgery .  Going for hip resurfacing ( easier than hip replacement )  Was diagnosced with PAF by Dr. Graciela Husbands about 15 years ago,. Has had an echo in the past (report was not found)  Has been fairly active - now limited by his hip pain .  Previously owed [employer].   Sold it to [employer] last year.   He had foot surgery this past April without problems.   Dec. 11, 2017:  Doing well .   Exercising well.   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    Is on both  Lisinopril and Losartan   Past Medical History:  Diagnosis Date  . Atrial fibrillation (HCC)   . Hyperlipidemia   . Hypertension     Past Surgical History:  Procedure Laterality Date  . FOOT SURGERY Right 08/08/2014  . knee scopes  1992, 2003  . right hip replacement Right   . TOTAL KNEE ARTHROPLASTY  2005   right      Current Outpatient Prescriptions  Medication Sig Dispense Refill  . aspirin 81 MG tablet Take 81 mg by mouth 2 (two) times daily.     Marland Kitchen atorvastatin (LIPITOR) 40 MG tablet TAKE 1 TABLET (40 MG TOTAL) BY MOUTH DAILY. 90 tablet 3  . ergocalciferol (VITAMIN D2) 50000 UNITS capsule Take 1 capsule (50,000 Units total) by mouth once a week. 12 capsule 3  . fenofibrate 160 MG tablet TAKE 1 TABLET (160 MG TOTAL) BY MOUTH DAILY. 90 tablet 3  . lisinopril-hydrochlorothiazide (PRINZIDE,ZESTORETIC) 20-12.5 MG tablet TAKE 1 TABLET BY MOUTH DAILY. 90  tablet 3  . losartan (COZAAR) 100 MG tablet TAKE 1 TABLET (100 MG TOTAL) BY MOUTH DAILY. 90 tablet 3  . verapamil (CALAN-SR) 240 MG CR tablet TAKE 1 TABLET (240 MG TOTAL) BY MOUTH AT BEDTIME. 90 tablet 3   No current facility-administered medications for this visit.     Allergies:   Patient has no known allergies.    Social History:  The patient  reports that he has never smoked. He has never used smokeless tobacco. He reports that he drinks alcohol. He reports that he does not use drugs.   Family History:  The patient's family history includes Cancer in his father and other; Heart disease in his father.    ROS:  Please see the history of present illness.    Review of Systems: Constitutional:  denies fever, chills, diaphoresis, appetite change and fatigue.  HEENT: denies photophobia, eye pain, redness, hearing loss, ear pain, congestion, sore throat, rhinorrhea, sneezing, neck pain, neck stiffness and tinnitus.  Respiratory: denies SOB, DOE, cough, chest tightness, and wheezing.  Cardiovascular: denies chest pain, palpitations and leg swelling.  Gastrointestinal: denies nausea, vomiting, abdominal pain, diarrhea, constipation, blood in stool.  Genitourinary: denies dysuria, urgency, frequency, hematuria, flank pain and difficulty urinating.  Musculoskeletal: denies  myalgias, back pain, joint  swelling, arthralgias and gait problem.   Skin: denies pallor, rash and wound.  Neurological: denies dizziness, seizures, syncope, weakness, light-headedness, numbness and headaches.   Hematological: denies adenopathy, easy bruising, personal or family bleeding history.  Psychiatric/ Behavioral: denies suicidal ideation, mood changes, confusion, nervousness, sleep disturbance and agitation.       All other systems are reviewed and negative.    PHYSICAL EXAM: VS:  BP 112/90 (BP Location: Left Arm, Patient Position: Sitting, Cuff Size: Large)   Pulse (!) 105   Ht 6' (1.829 m)   Wt 258 lb  (117 kg)   BMI 34.99 kg/m  , BMI Body mass index is 34.99 kg/m. GEN: Well nourished, well developed, in no acute distress  HEENT: normal  Neck: no JVD, carotid bruits, or masses Cardiac: ; no murmurs, rubs, or gallops,no edema  Respiratory:  clear to auscultation bilaterally, normal work of breathing GI: soft, nontender, nondistended, + BS MS: no deformity or atrophy  Skin: warm and dry, no rash Neuro:  Strength and sensation are intact Psych: normal   EKG:  EKG is ordered today. The ekg ordered Dec. 11, 2017:   Atrial fib with RVR.   NS ST abn    Recent Labs: 03/07/2016: ALT 19; BUN 31; Creatinine, Ser 1.48; Hemoglobin 14.9; Platelets 174.0; Potassium 4.3; Sodium 145; TSH 0.96    Lipid Panel    Component Value Date/Time   CHOL 220 (H) 03/07/2016 1039   TRIG 94.0 03/07/2016 1039   HDL 58.30 03/07/2016 1039   CHOLHDL 4 03/07/2016 1039   VLDL 18.8 03/07/2016 1039   LDLCALC 143 (H) 03/07/2016 1039   LDLDIRECT 146.9 03/05/2014 0944      Wt Readings from Last 3 Encounters:  04/18/16 258 lb (117 kg)  04/15/16 256 lb (116.1 kg)  03/14/16 256 lb 14.4 oz (116.5 kg)      Other studies Reviewed: Additional studies/ records that were reviewed today include: . Review of the above records demonstrates:    ASSESSMENT AND PLAN:  1.  Atrial fib - likely chronic at this point. His heart rate is rapid today. I would like to discontinue the verapamil and start him on metoprolol 50 mg twice a day. We will have him return in several weeks for a nurse visit for heart rate and blood pressure check.  I'll see him in 3 months. Currently his CHADS 2VASC is 1. He'll be trying 65 years old in March 2018. We will discuss starting him on a DOAC at that time.  2. Essential HTN Will DC the Lisinopril HCT.  He is on Losartan Will be starting metoprolol 50 BID,  DC verapamil. Adding HCTZ 12.5 mg a day ( inplace of the Lisinopril HTC. )   Will have Him come in for a nurse visit for blood  pressure and heart rate check as well as a basic medical profile in one month.  He will check with his insurance plan to see which one they have on formulary .    The following changes have been made:  no change  Labs/ tests ordered today include:  No orders of the defined types were placed in this encounter.    Disposition:   FU with me in  3 months     Kristeen MissPhilip Nahser, MD  04/18/2016 3:31 PM    [facility] [address], [place], [place]  [address] Phone: [phone]; Fax: [phone]   [facility]  [address] [place],  [place]  [id number] [phone]   Fax [phone]

## 2016-04-19 ENCOUNTER — Other Ambulatory Visit: Payer: Self-pay | Admitting: Family Medicine

## 2016-04-29 ENCOUNTER — Ambulatory Visit (INDEPENDENT_AMBULATORY_CARE_PROVIDER_SITE_OTHER): Payer: BLUE CROSS/BLUE SHIELD | Admitting: Nurse Practitioner

## 2016-04-29 DIAGNOSIS — I482 Chronic atrial fibrillation, unspecified: Secondary | ICD-10-CM

## 2016-04-29 DIAGNOSIS — I10 Essential (primary) hypertension: Secondary | ICD-10-CM | POA: Diagnosis not present

## 2016-04-29 LAB — BASIC METABOLIC PANEL
BUN: 34 mg/dL — AB (ref 7–25)
CHLORIDE: 105 mmol/L (ref 98–110)
CO2: 24 mmol/L (ref 20–31)
Calcium: 9.5 mg/dL (ref 8.6–10.3)
Creat: 1.44 mg/dL — ABNORMAL HIGH (ref 0.70–1.25)
Glucose, Bld: 94 mg/dL (ref 65–99)
POTASSIUM: 4.1 mmol/L (ref 3.5–5.3)
SODIUM: 140 mmol/L (ref 135–146)

## 2016-04-29 MED ORDER — METOPROLOL TARTRATE 50 MG PO TABS: 50.0000 mg | ORAL_TABLET | Freq: Two times a day (BID) | ORAL | 3 refills | Status: DC

## 2016-04-29 MED ORDER — AMLODIPINE BESYLATE 5 MG PO TABS: 5.0000 mg | ORAL_TABLET | Freq: Every day | ORAL | 3 refills | Status: DC

## 2016-04-29 MED ORDER — HYDROCHLOROTHIAZIDE 25 MG PO TABS: 25.0000 mg | ORAL_TABLET | Freq: Every day | ORAL | 3 refills | Status: DC

## 2016-04-29 NOTE — Patient Instructions (Signed)
Medication Instructions:  INCREASE HCTZ to 25 mg once daily in the AM START Amlodipine 5 mg once daily   Labwork: TODAY - basic metabolic panel   Testing/Procedures: None Ordered   Follow-Up: Your physician has requested that you regularly monitor and record your blood pressure readings at home. Please use the same machine at the same time of day to check your readings and record them and call Marcelino DusterMichelle, RN to report in 2 weeks   Your physician recommends that you schedule a follow-up appointment in: early April - you will receive a letter when Dr. Harvie BridgeNahser's schedule is available, then call us to schedule appointment   If you need a refill on your cardiac medications before your next appointment, please call your pharmacy.   Thank you for choosing [facility]! Eligha BridegroomMichelle Reva Pinkley, RN [phone]

## 2016-04-29 NOTE — Progress Notes (Signed)
1.) Reason for visit: BP recheck   2.) Name of MD requesting visit: Dr. Elease HashimotoNahser  H&P: patient presents for recheck of BP after office visit on 12/11 with Dr. Elease HashimotoNahser. At that visit, patient's medications were changed and patient was asked to return today for BP check/nurse visit and bmet  3.) ROS related to problem: patient states he is aware of a change in how he feels since starting metoprolol; denies complaints  BP Today is 138/100 mmHg, pulse 68 bpm.    Recheck of BP is 130/100 mmHg  4.) Assessment and plan per MD: Increase HCTZ to 25 mg once daily  Will call back to add K+ if needed after bmet  results are available  Amlodipine 5 mg once daily

## 2016-05-03 ENCOUNTER — Telehealth: Payer: Self-pay | Admitting: Nurse Practitioner

## 2016-05-03 DIAGNOSIS — I10 Essential (primary) hypertension: Secondary | ICD-10-CM

## 2016-05-03 MED ORDER — POTASSIUM CHLORIDE CRYS ER 20 MEQ PO TBCR: 20.0000 meq | EXTENDED_RELEASE_TABLET | Freq: Every day | ORAL | 11 refills | Status: DC

## 2016-05-03 NOTE — Telephone Encounter (Signed)
Per Dr. Elease HashimotoNahser:  Lets add Kdur 20 meq a day . Check BMP in 3 weeks  Patient verbalized understanding and agreement with plan.  He is scheduled for recheck of bmet on 1/18.

## 2016-05-03 NOTE — Telephone Encounter (Signed)
-----   Message from Vesta MixerPhilip J Nahser, MD sent at 05/03/2016  5:53 AM EST ----- Labs are stable

## 2016-05-26 ENCOUNTER — Other Ambulatory Visit: Payer: BLUE CROSS/BLUE SHIELD

## 2016-05-31 ENCOUNTER — Other Ambulatory Visit: Payer: BLUE CROSS/BLUE SHIELD | Admitting: *Deleted

## 2016-05-31 DIAGNOSIS — I480 Paroxysmal atrial fibrillation: Secondary | ICD-10-CM

## 2016-05-31 DIAGNOSIS — I10 Essential (primary) hypertension: Secondary | ICD-10-CM

## 2016-06-05 ENCOUNTER — Encounter: Payer: Self-pay | Admitting: Cardiovascular Disease

## 2016-06-06 ENCOUNTER — Telehealth: Payer: Self-pay | Admitting: Nurse Practitioner

## 2016-06-06 MED ORDER — POTASSIUM CHLORIDE CRYS ER 20 MEQ PO TBCR: 20.0000 meq | EXTENDED_RELEASE_TABLET | Freq: Every day | ORAL | 3 refills | Status: DC

## 2016-06-06 NOTE — Telephone Encounter (Signed)
Patient's request for 90 day supply of medication received via MyChart.

## 2016-06-14 ENCOUNTER — Encounter: Payer: Self-pay | Admitting: Cardiovascular Disease

## 2016-07-12 ENCOUNTER — Other Ambulatory Visit: Payer: Self-pay | Admitting: Cardiovascular Disease

## 2016-07-12 MED ORDER — AMLODIPINE BESYLATE 5 MG PO TABS: 5.0000 mg | ORAL_TABLET | Freq: Every day | ORAL | 2 refills | Status: DC

## 2016-07-12 MED ORDER — METOPROLOL TARTRATE 50 MG PO TABS: 50.0000 mg | ORAL_TABLET | Freq: Two times a day (BID) | ORAL | 2 refills | Status: DC

## 2016-07-14 ENCOUNTER — Other Ambulatory Visit: Payer: Self-pay | Admitting: *Deleted

## 2016-07-14 MED ORDER — ATORVASTATIN CALCIUM 40 MG PO TABS: ORAL_TABLET | ORAL | 3 refills | Status: DC

## 2016-07-14 MED ORDER — VITAMIN D (ERGOCALCIFEROL) 1.25 MG (50000 UNIT) PO CAPS: 50000.0000 [IU] | ORAL_CAPSULE | ORAL | 5 refills | Status: AC

## 2016-07-14 MED ORDER — FENOFIBRATE 160 MG PO TABS: ORAL_TABLET | ORAL | 3 refills | Status: AC

## 2016-07-20 ENCOUNTER — Other Ambulatory Visit: Payer: Self-pay | Admitting: *Deleted

## 2016-07-20 MED ORDER — LOSARTAN POTASSIUM 100 MG PO TABS: ORAL_TABLET | ORAL | 3 refills | Status: AC

## 2016-08-08 ENCOUNTER — Other Ambulatory Visit: Payer: Self-pay | Admitting: Family Medicine

## 2016-08-14 ENCOUNTER — Encounter: Payer: Self-pay | Admitting: Cardiovascular Disease

## 2016-08-15 ENCOUNTER — Other Ambulatory Visit: Payer: Self-pay | Admitting: *Deleted

## 2016-08-15 MED ORDER — HYDROCHLOROTHIAZIDE 25 MG PO TABS: 25.0000 mg | ORAL_TABLET | Freq: Every day | ORAL | 1 refills | Status: DC

## 2016-08-15 MED ORDER — POTASSIUM CHLORIDE CRYS ER 20 MEQ PO TBCR: 20.0000 meq | EXTENDED_RELEASE_TABLET | Freq: Every day | ORAL | 1 refills | Status: DC

## 2016-08-26 ENCOUNTER — Encounter: Payer: Self-pay | Admitting: Cardiovascular Disease

## 2016-08-26 ENCOUNTER — Ambulatory Visit (INDEPENDENT_AMBULATORY_CARE_PROVIDER_SITE_OTHER): Payer: Medicare Other | Admitting: Cardiovascular Disease

## 2016-08-26 VITALS — BP 142/110 | HR 104 | Ht 72.0 in | Wt 261.8 lb

## 2016-08-26 DIAGNOSIS — E782 Mixed hyperlipidemia: Secondary | ICD-10-CM

## 2016-08-26 DIAGNOSIS — I10 Essential (primary) hypertension: Secondary | ICD-10-CM | POA: Diagnosis not present

## 2016-08-26 DIAGNOSIS — I482 Chronic atrial fibrillation, unspecified: Secondary | ICD-10-CM

## 2016-08-26 LAB — COMPREHENSIVE METABOLIC PANEL
ALBUMIN: 4.5 g/dL (ref 3.6–4.8)
ALK PHOS: 31 IU/L — AB (ref 39–117)
ALT: 18 IU/L (ref 0–44)
AST: 24 IU/L (ref 0–40)
Albumin/Globulin Ratio: 1.8 (ref 1.2–2.2)
BUN / CREAT RATIO: 19 (ref 10–24)
BUN: 32 mg/dL — AB (ref 8–27)
Bilirubin Total: 0.7 mg/dL (ref 0.0–1.2)
CALCIUM: 10.1 mg/dL (ref 8.6–10.2)
CO2: 22 mmol/L (ref 18–29)
CREATININE: 1.66 mg/dL — AB (ref 0.76–1.27)
Chloride: 98 mmol/L (ref 96–106)
GFR calc Af Amer: 49 mL/min/{1.73_m2} — ABNORMAL LOW (ref >59)
GFR calc non Af Amer: 43 mL/min/{1.73_m2} — ABNORMAL LOW (ref >59)
Globulin, Total: 2.5 g/dL (ref 1.5–4.5)
Glucose: 103 mg/dL — ABNORMAL HIGH (ref 65–99)
Potassium: 4.5 mmol/L (ref 3.5–5.2)
Sodium: 139 mmol/L (ref 134–144)
TOTAL PROTEIN: 7 g/dL (ref 6.0–8.5)

## 2016-08-26 LAB — LIPID PANEL
CHOLESTEROL TOTAL: 205 mg/dL — AB (ref 100–199)
Chol/HDL Ratio: 4.3 ratio (ref 0.0–5.0)
HDL: 48 mg/dL (ref >39)
LDL CALC: 136 mg/dL — AB (ref 0–99)
Triglycerides: 105 mg/dL (ref 0–149)
VLDL CHOLESTEROL CAL: 21 mg/dL (ref 5–40)

## 2016-08-26 MED ORDER — METOPROLOL TARTRATE 50 MG PO TABS: 50.0000 mg | ORAL_TABLET | Freq: Two times a day (BID) | ORAL | 3 refills | Status: DC

## 2016-08-26 MED ORDER — METOPROLOL TARTRATE 25 MG PO TABS: 25.0000 mg | ORAL_TABLET | Freq: Two times a day (BID) | ORAL | 3 refills | Status: DC

## 2016-08-26 MED ORDER — METOPROLOL TARTRATE 50 MG PO TABS: 75.0000 mg | ORAL_TABLET | Freq: Two times a day (BID) | ORAL | 3 refills | Status: DC

## 2016-08-26 MED ORDER — APIXABAN 5 MG PO TABS: 5.0000 mg | ORAL_TABLET | Freq: Two times a day (BID) | ORAL | 11 refills | Status: AC

## 2016-08-26 NOTE — Progress Notes (Signed)
Cardiology Office Note   Date:  08/26/2016   ID:  Joseph [name], DOB Jun 04, 1950, MRN [id number]  PCP:  Kristian Covey, MD  Cardiologist:   Kristeen Miss, MD   Chief Complaint  Patient presents with  . Follow-up    atrial fib   Problem list 1. Atrial fibrillation 2. Essential hypertension  3. Hyperlipidemia  History of Present Illness: Joseph [name] is a 66 y.o. male who presents for evaluation of his atrial fibrillation. Has had a-fib in the past.  Was noticed on an ECG done for pre-op visit for hip surgery .  Going for hip resurfacing ( easier than hip replacement )  Was diagnosced with PAF by Dr. Graciela Husbands about 15 years ago,. Has had an echo in the past (report was not found)  Has been fairly active - now limited by his hip pain .  Previously owed [employer].   Sold it to [employer] last year.   He had foot surgery this past April without problems.   Dec. 11, 2017:  Doing well .   Exercising well.   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    Is on both  Lisinopril and Losartan   August 26, 2016:  Joseph [name] is feeling well.  No CP or dyspnea. BP is always good  .   Is elevated here. Went out to dinner last night.   Had fried oysters.  May have eaten a bit more salt  Exercises daily .      Past Medical History:  Diagnosis Date  . Atrial fibrillation (HCC)   . Hyperlipidemia   . Hypertension     Past Surgical History:  Procedure Laterality Date  . FOOT SURGERY Right 08/08/2014  . knee scopes  1992, 2003  . right hip replacement Right   . TOTAL KNEE ARTHROPLASTY  2005   right      Current Outpatient Prescriptions  Medication Sig Dispense Refill  . amLODipine (NORVASC) 5 MG tablet Take 1 tablet (5 mg total) by mouth daily. 90 tablet 2  . aspirin 81 MG tablet Take 81 mg by mouth 2 (two) times daily.     Marland Kitchen atorvastatin (LIPITOR) 40 MG tablet TAKE 1 TABLET (40 MG TOTAL) BY MOUTH DAILY. 90 tablet 3  .  fenofibrate 160 MG tablet TAKE 1 TABLET (160 MG TOTAL) BY MOUTH DAILY. 90 tablet 3  . hydrochlorothiazide (HYDRODIURIL) 25 MG tablet Take 1 tablet (25 mg total) by mouth daily. 90 tablet 1  . losartan (COZAAR) 100 MG tablet TAKE 1 TABLET (100 MG TOTAL) BY MOUTH DAILY. 90 tablet 3  . metoprolol (LOPRESSOR) 50 MG tablet Take 1 tablet (50 mg total) by mouth 2 (two) times daily. 180 tablet 2  . potassium chloride SA (K-DUR,KLOR-CON) 20 MEQ tablet Take 1 tablet (20 mEq total) by mouth daily. 90 tablet 1  . triamcinolone cream (KENALOG) 0.1 % APPLY TOPICALLY 2 TIMES DAILY 454 g 1  . Vitamin D, Ergocalciferol, (DRISDOL) 50000 units CAPS capsule Take 1 capsule (50,000 Units total) by mouth once a week. 12 capsule 5   No current facility-administered medications for this visit.     Allergies:   Patient has no known allergies.    Social History:  The patient  reports that he has never smoked. He has never used smokeless tobacco. He reports that he drinks alcohol. He reports that he does not use drugs.   Family History:  The patient's family history  includes Cancer in his father and other; Heart disease in his father.    ROS:  Please see the history of present illness.    Review of Systems: Constitutional:  denies fever, chills, diaphoresis, appetite change and fatigue.  HEENT: denies photophobia, eye pain, redness, hearing loss, ear pain, congestion, sore throat, rhinorrhea, sneezing, neck pain, neck stiffness and tinnitus.  Respiratory: denies SOB, DOE, cough, chest tightness, and wheezing.  Cardiovascular: denies chest pain, palpitations and leg swelling.  Gastrointestinal: denies nausea, vomiting, abdominal pain, diarrhea, constipation, blood in stool.  Genitourinary: denies dysuria, urgency, frequency, hematuria, flank pain and difficulty urinating.  Musculoskeletal: denies  myalgias, back pain, joint swelling, arthralgias and gait problem.   Skin: denies pallor, rash and wound.    Neurological: denies dizziness, seizures, syncope, weakness, light-headedness, numbness and headaches.   Hematological: denies adenopathy, easy bruising, personal or family bleeding history.  Psychiatric/ Behavioral: denies suicidal ideation, mood changes, confusion, nervousness, sleep disturbance and agitation.       All other systems are reviewed and negative.    PHYSICAL EXAM: VS:  BP (!) 142/110 (BP Location: Right Arm, Patient Position: Sitting, Cuff Size: Large)   Pulse (!) 104   Ht 6' (1.829 m)   Wt 261 lb 12.8 oz (118.8 kg)   SpO2 99%   BMI 35.51 kg/m  , BMI Body mass index is 35.51 kg/m. GEN: Well nourished, well developed, in no acute distress  HEENT: normal  Neck: no JVD, carotid bruits, or masses Cardiac: ; no murmurs, rubs, or gallops,no edema  Respiratory:  clear to auscultation bilaterally, normal work of breathing GI: soft, nontender, nondistended, + BS MS: no deformity or atrophy  Skin: warm and dry, no rash Neuro:  Strength and sensation are intact Psych: normal   EKG:  EKG is ordered today. The ekg ordered Dec. 11, 2017:   Atrial fib with RVR.   NS ST abn    Recent Labs: 03/07/2016: ALT 19; Hemoglobin 14.9; Platelets 174.0; TSH 0.96 04/29/2016: BUN 34; Creat 1.44; Potassium 4.1; Sodium 140    Lipid Panel    Component Value Date/Time   CHOL 220 (H) 03/07/2016 1039   TRIG 94.0 03/07/2016 1039   HDL 58.30 03/07/2016 1039   CHOLHDL 4 03/07/2016 1039   VLDL 18.8 03/07/2016 1039   LDLCALC 143 (H) 03/07/2016 1039   LDLDIRECT 146.9 03/05/2014 0944      Wt Readings from Last 3 Encounters:  08/26/16 261 lb 12.8 oz (118.8 kg)  04/29/16 255 lb 8 oz (115.9 kg)  04/18/16 258 lb (117 kg)      Other studies Reviewed: Additional studies/ records that were reviewed today include: . Review of the above records demonstrates:    ASSESSMENT AND PLAN:  1.  Atrial fib - likely chronic at this point. Heart rate remains a little fast. We will increase  the metoprolol to 75 mg twice a day.    Currently his CHADS 2VASC is 2.  will start him on Eliquis Return in 1 month for CBC and BMP    2. Essential HTN BP is good at home Advised him to avoid eating lots of salt. I've encouraged him to exercise and to work on weight loss program..    The following changes have been made:  no change  Labs/ tests ordered today include:  No orders of the defined types were placed in this encounter.    Disposition:   FU with me in  6  months     Kristeen Miss,  MD  08/26/2016 8:45 AM    [facility] [address] [place], [place], [place]  [address] Phone: [phone]; Fax: [phone]

## 2016-08-26 NOTE — Patient Instructions (Addendum)
Medication Instructions:  INCREASE Metoprolol (Lopressor) to 75 mg twice daily START Eliquis 5 mg twice daily   Labwork: TODAY - cholesterol, complete metabolic panel  Your physician recommends that you return for lab work in: 1 month for CBC, BMET on the same day as your appointment with [facility]   Testing/Procedures: None Ordered   Follow-Up: Your physician recommends that you schedule a follow-up appointment in: 1 month with [facility] for new start Eliquis   Your physician wants you to follow-up in: 6 months with Dr. Elease Hashimoto.  You will receive a reminder letter in the mail two months in advance. If you don't receive a letter, please call our office to schedule the follow-up appointment.   If you need a refill on your cardiac medications before your next appointment, please call your pharmacy.   Thank you for choosing [facility]! Eligha Bridegroom, RN [phone]

## 2016-08-29 ENCOUNTER — Telehealth: Payer: Self-pay | Admitting: Cardiovascular Disease

## 2016-08-29 DIAGNOSIS — E782 Mixed hyperlipidemia: Secondary | ICD-10-CM

## 2016-08-29 MED ORDER — ROSUVASTATIN CALCIUM 10 MG PO TABS: 10.0000 mg | ORAL_TABLET | Freq: Every day | ORAL | 3 refills | Status: DC

## 2016-08-29 NOTE — Telephone Encounter (Signed)
New message    Pt is calling returning call about labs.

## 2016-08-29 NOTE — Telephone Encounter (Signed)
Reviewed lab results and plan of care with patient who verbalized understanding and agreement. He is scheduled for repeat lipid/liver panel on 7/31. I advised him to call back sooner with questions or concerns. He thanked me for the call.

## 2016-09-27 ENCOUNTER — Ambulatory Visit (INDEPENDENT_AMBULATORY_CARE_PROVIDER_SITE_OTHER): Payer: Medicare Other | Admitting: *Deleted

## 2016-09-27 ENCOUNTER — Other Ambulatory Visit: Payer: Medicare Other

## 2016-09-27 DIAGNOSIS — I10 Essential (primary) hypertension: Secondary | ICD-10-CM

## 2016-09-27 DIAGNOSIS — E782 Mixed hyperlipidemia: Secondary | ICD-10-CM

## 2016-09-27 DIAGNOSIS — I482 Chronic atrial fibrillation, unspecified: Secondary | ICD-10-CM

## 2016-09-27 DIAGNOSIS — I4891 Unspecified atrial fibrillation: Secondary | ICD-10-CM | POA: Diagnosis not present

## 2016-09-27 NOTE — Progress Notes (Signed)
Pt was started on Eliquis 5mg  twice a day for AFIB by Dr. Elease HashimotoNahser on 08/26/16.    Reviewed patients medication list.  Pt is not currently on any combined P-gp and strong CYP3A4 inhibitors/inducers (ketoconazole, traconazole, ritonavir, carbamazepine, phenytoin, rifampin, St. John's wort).  Reviewed labs: Hgb-14.66 , HCT-44.8, SCr-1.55, and Weight-116.9kg.  Dose appropriate based on dosing criteria (age, weight, and SCr).  Hgb and HCT within normal limits.   A full discussion of the nature of anticoagulants has been carried out.  A benefit/risk analysis has been presented to the patient, so that they understand the justification for choosing anticoagulation with Eliquis at this time.  The need for compliance is stressed.  Pt is aware to take the medication twice daily.  Side effects of potential bleeding are discussed, including unusual colored urine or stools, coughing up blood or coffee ground emesis, nose bleeds or serious fall or head trauma.  Discussed signs and symptoms of stroke. The patient should avoid any OTC items containing aspirin or ibuprofen.  Avoid alcohol consumption.   Call if any signs of abnormal bleeding.  Discussed financial obligations and resolved any difficulty in obtaining medication.  Next lab test in 6 months.   09/27/16-taken to the lab to have labs drawn & he is aware that he would receive a call regarding labs after they are resulted.  09/28/16-Spoke with pt & instructed to continue taking Eliquis 5mg  twice a day. Also, pt stated that he would prefer his Eliquis 5mg  be sent to [facility] with a 90 day supply instead of 30 day supply. Therefore, called [facility] & spoke with Corrina & Kay-Pharmacist and gave Joyce GrossKay orders for 180 days tabs with 3 refills & she would remove the other prescription to ensure that 90 day supply is sent out next time. However, she stated the 30 day supply was due to be shipped out today & she will try to have the 90 day sent instead but could not  guarantee this. Advised that if she could stop the 30 day supply from being sent to send the 180 tabs on the next refill & she stated she would & gave much appreciation for her assistance.

## 2016-09-28 LAB — CBC
HEMATOCRIT: 44.8 % (ref 37.5–51.0)
HEMOGLOBIN: 14.6 g/dL (ref 13.0–17.7)
MCH: 32.7 pg (ref 26.6–33.0)
MCHC: 32.6 g/dL (ref 31.5–35.7)
MCV: 100 fL — ABNORMAL HIGH (ref 79–97)
Platelets: 163 10*3/uL (ref 150–379)
RBC: 4.46 x10E6/uL (ref 4.14–5.80)
RDW: 14.2 % (ref 12.3–15.4)
WBC: 5.9 10*3/uL (ref 3.4–10.8)

## 2016-09-28 LAB — BASIC METABOLIC PANEL
BUN / CREAT RATIO: 17 (ref 10–24)
BUN: 26 mg/dL (ref 8–27)
CO2: 22 mmol/L (ref 18–29)
CREATININE: 1.55 mg/dL — AB (ref 0.76–1.27)
Calcium: 10.3 mg/dL — ABNORMAL HIGH (ref 8.6–10.2)
Chloride: 105 mmol/L (ref 96–106)
GFR calc Af Amer: 54 mL/min/{1.73_m2} — ABNORMAL LOW (ref >59)
GFR, EST NON AFRICAN AMERICAN: 46 mL/min/{1.73_m2} — AB (ref >59)
GLUCOSE: 99 mg/dL (ref 65–99)
Potassium: 4.5 mmol/L (ref 3.5–5.2)
SODIUM: 144 mmol/L (ref 134–144)

## 2016-10-26 ENCOUNTER — Other Ambulatory Visit: Payer: Self-pay | Admitting: Cardiovascular Disease

## 2016-12-06 ENCOUNTER — Other Ambulatory Visit: Payer: Medicare Other

## 2017-01-26 ENCOUNTER — Encounter: Payer: Self-pay | Admitting: Family Medicine

## 2017-01-31 ENCOUNTER — Other Ambulatory Visit: Payer: Self-pay | Admitting: Cardiovascular Disease

## 2017-02-28 IMAGING — DX DG CHEST 2V
2 series · 2 of 2 positions shown · non-contrast
Comparison: March 02, 2010

CLINICAL DATA: Preoperative evaluation.  Hypertension.

EXAM:
CHEST  2 VIEW

[chest lat]
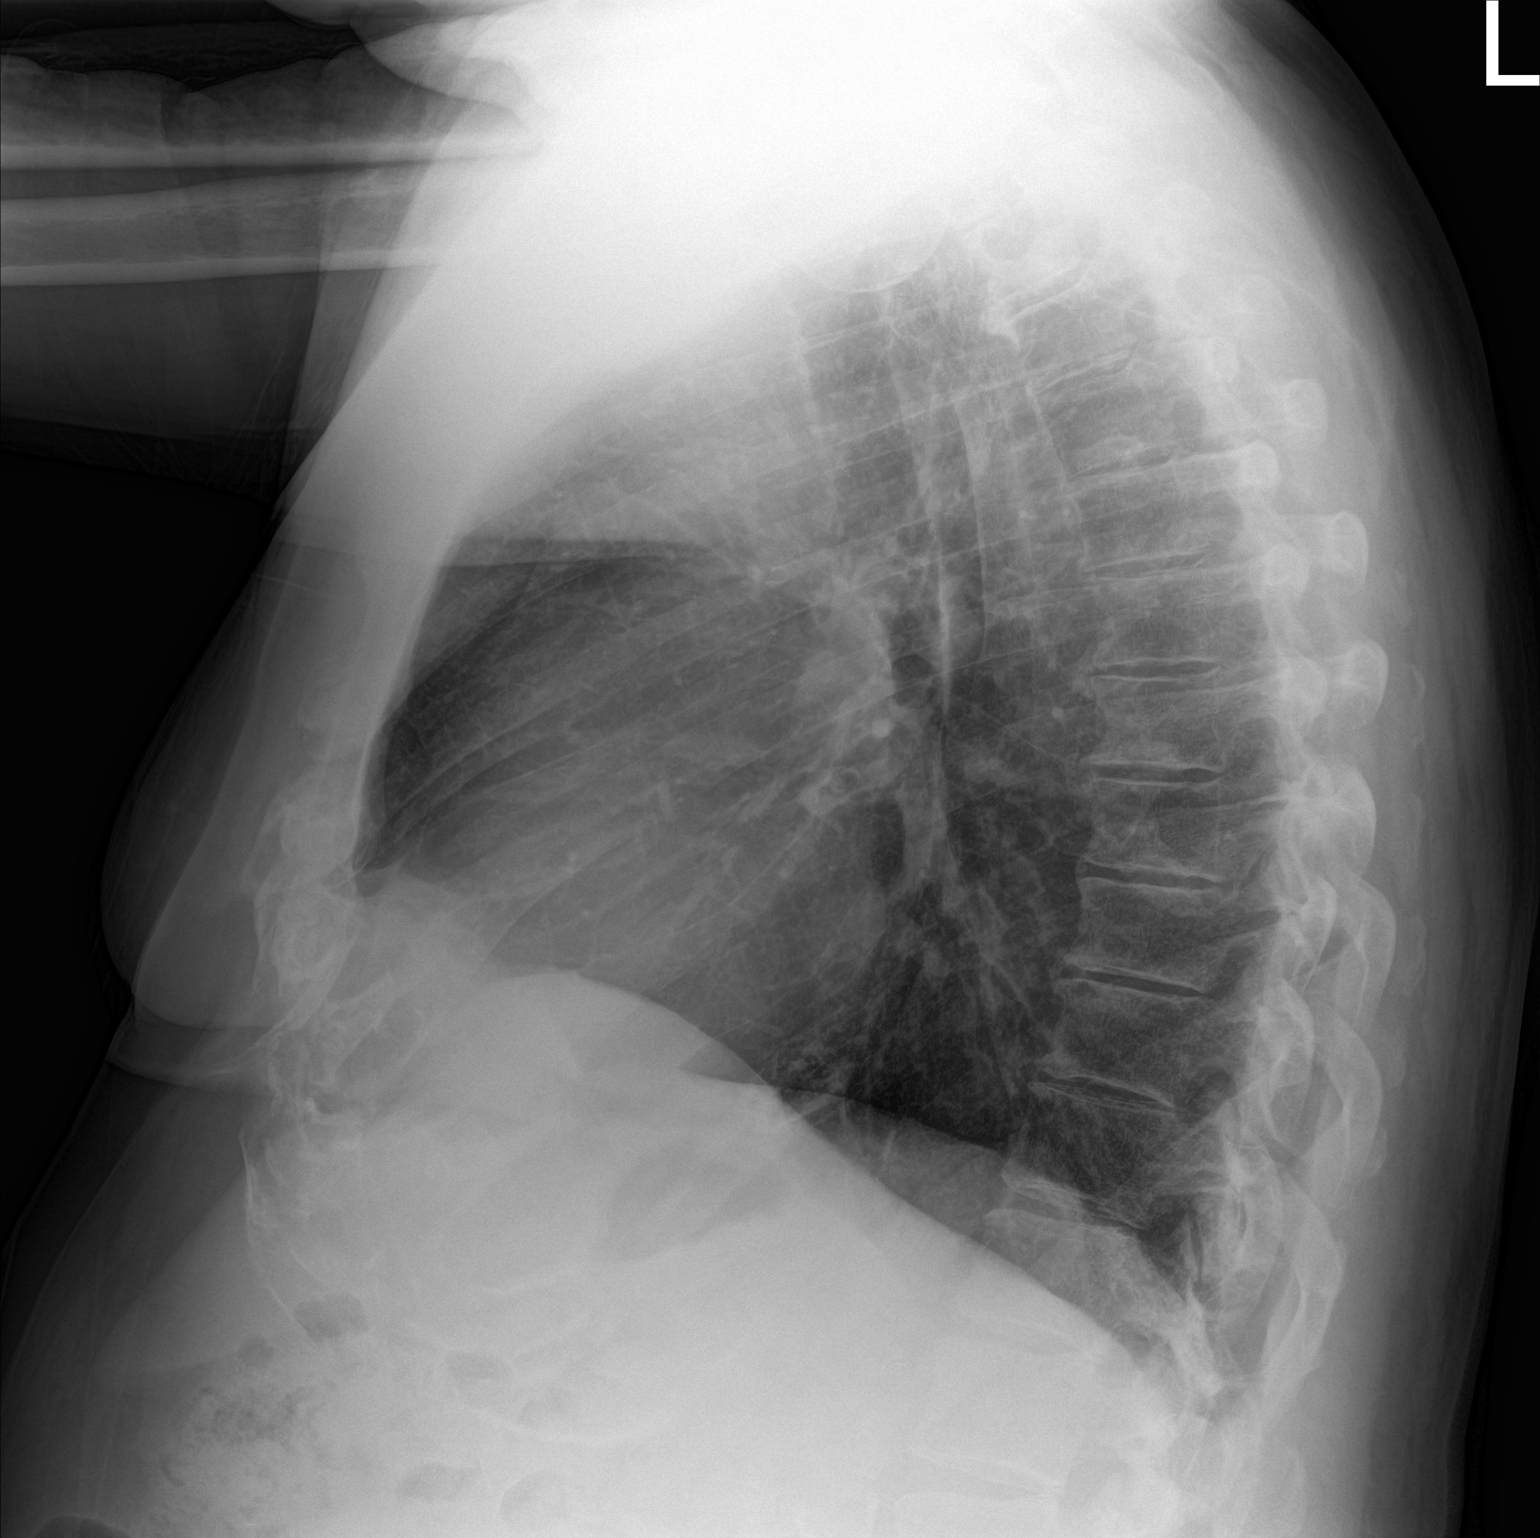

[chest pa]
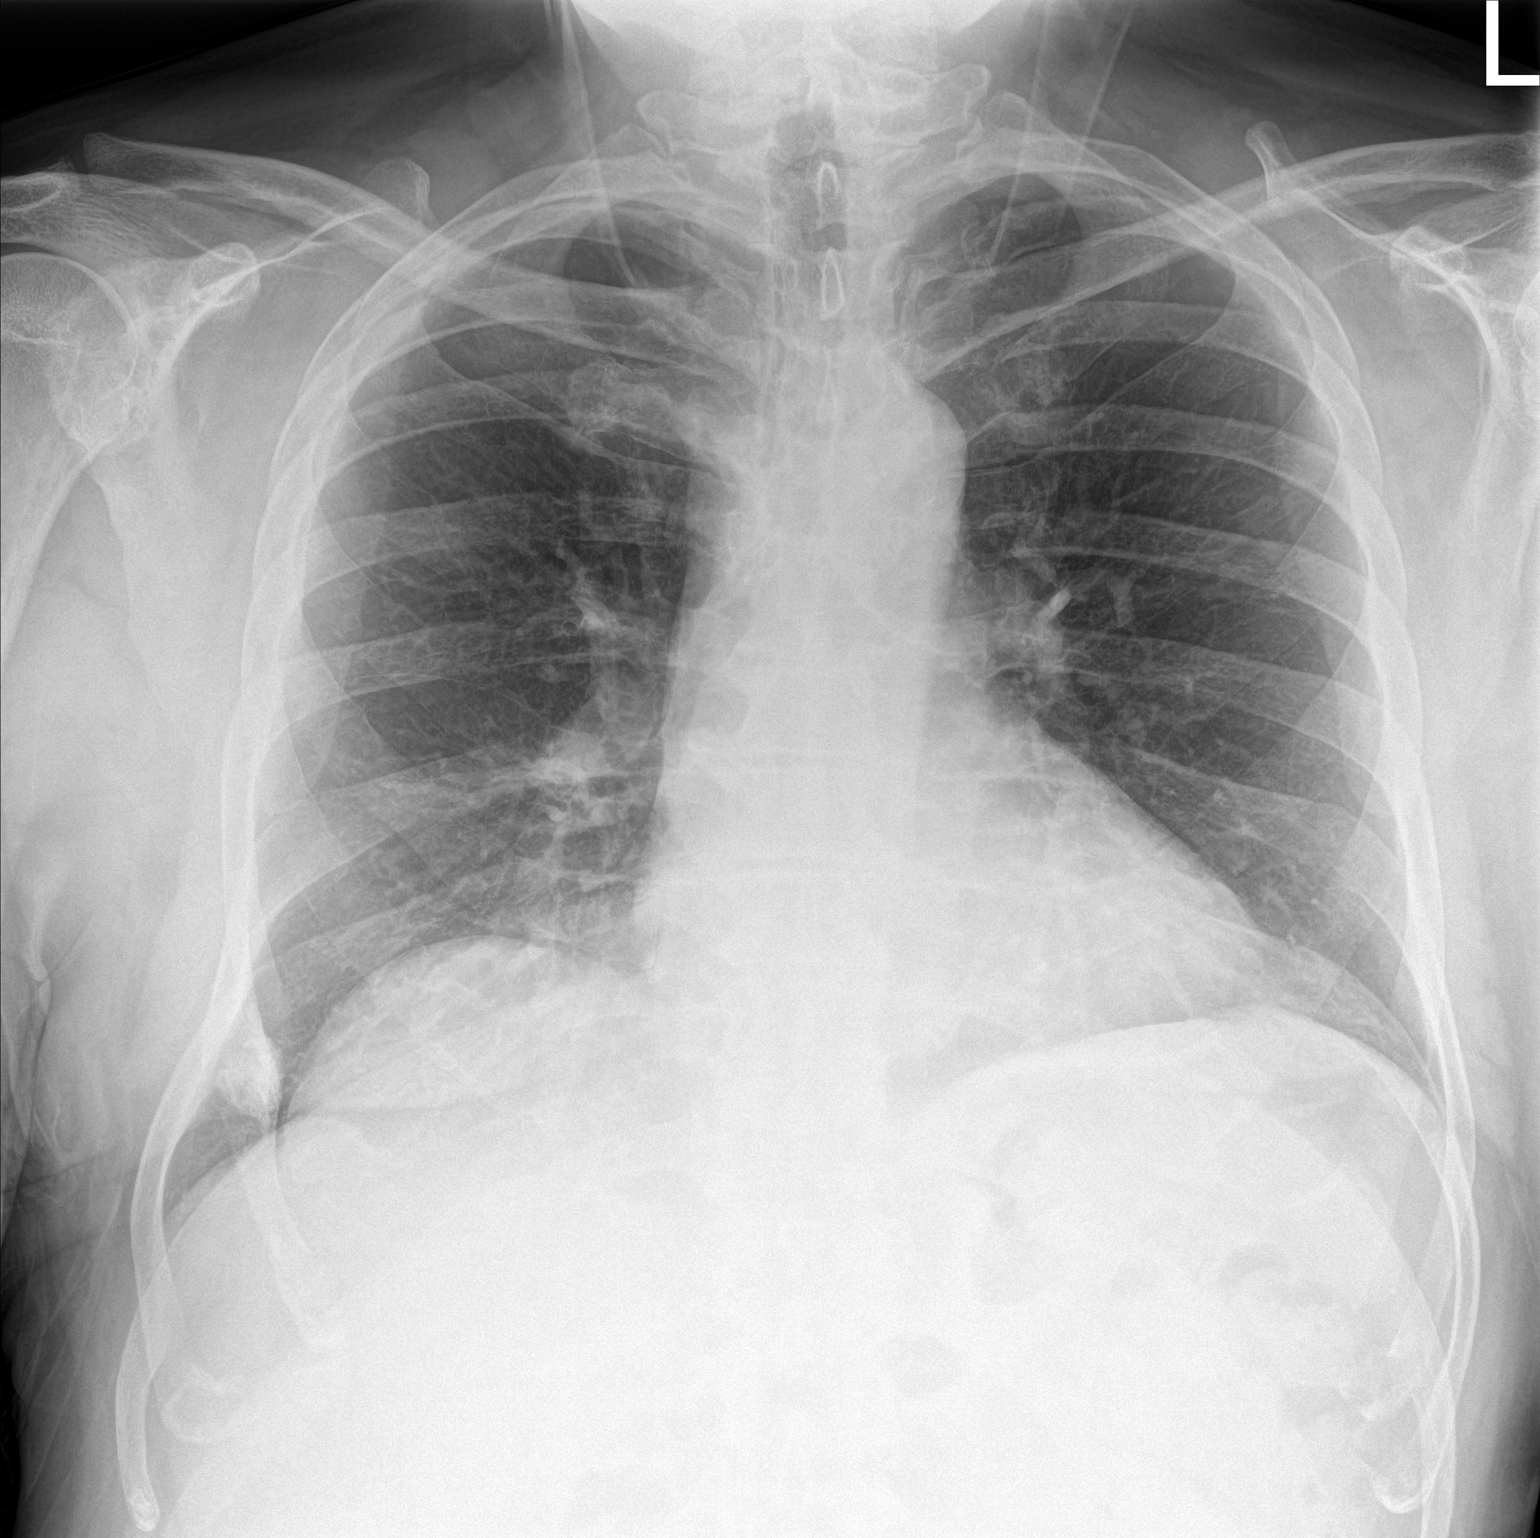

[2 of 2 positions shown; findings below may reference images not displayed]

FINDINGS: There is no edema or consolidation. There is a stable calcified
granuloma in the lateral right base. Heart size and pulmonary
vascularity are normal. No adenopathy. There is mild degenerative
change in the thoracic spine.
IMPRESSION: Stable calcified granuloma lateral right base. No edema or
consolidation.

## 2017-03-29 ENCOUNTER — Other Ambulatory Visit: Payer: Self-pay | Admitting: Cardiovascular Disease

## 2017-04-03 ENCOUNTER — Ambulatory Visit: Payer: Medicare Other | Admitting: Pharmacist

## 2017-04-03 DIAGNOSIS — I4891 Unspecified atrial fibrillation: Secondary | ICD-10-CM

## 2017-04-03 NOTE — Progress Notes (Signed)
Pt was started on Eliquis 5mg  twice a day for AFIB by Dr. Elease HashimotoNahser on 08/26/16.    Reviewed patients medication list.  Pt is not currently on any combined P-gp and strong CYP3A4 inhibitors/inducers (ketoconazole, traconazole, ritonavir, carbamazepine, phenytoin, rifampin, St. John's wort).  Reviewed labs.  SCr has increased to 1.82, Weight 260 lbs, age 66 yo. Dose appropriate based on age, weight, and SCr.  Hgb and HCT have both fallen, Hgb has dropped from 14.6 to 11.5. Per Dr Elease HashimotoNahser, pt is to have BMET and CBC rechecked in 1 month and f/[place] in [place] hemoglobin.  A full discussion of the nature of anticoagulants has been carried out.  A benefit/risk analysis has been presented to the patient, so that they understand the justification for choosing anticoagulation with Eliquis at this time.  The need for compliance is stressed.  Pt is aware to take the medication twice daily.  Side effects of potential bleeding are discussed, including unusual colored urine or stools, coughing up blood or coffee ground emesis, nose bleeds or serious fall or head trauma.  Discussed signs and symptoms of stroke. The patient should avoid any OTC items containing aspirin or ibuprofen.  Avoid alcohol consumption.   Call if any signs of abnormal bleeding.  Discussed financial obligations and resolved any difficulty in obtaining medication.  Next lab test in 6 months.

## 2017-04-04 ENCOUNTER — Telehealth: Payer: Self-pay | Admitting: Family Medicine

## 2017-04-04 LAB — BASIC METABOLIC PANEL
BUN / CREAT RATIO: 16 (ref 10–24)
BUN: 29 mg/dL — AB (ref 8–27)
CO2: 21 mmol/L (ref 20–29)
CREATININE: 1.82 mg/dL — AB (ref 0.76–1.27)
Calcium: 9.7 mg/dL (ref 8.6–10.2)
Chloride: 106 mmol/L (ref 96–106)
GFR calc non Af Amer: 38 mL/min/{1.73_m2} — ABNORMAL LOW (ref >59)
GFR, EST AFRICAN AMERICAN: 44 mL/min/{1.73_m2} — AB (ref >59)
Glucose: 103 mg/dL — ABNORMAL HIGH (ref 65–99)
Potassium: 4.7 mmol/L (ref 3.5–5.2)
Sodium: 143 mmol/L (ref 134–144)

## 2017-04-04 LAB — CBC
HEMATOCRIT: 35.6 % — AB (ref 37.5–51.0)
HEMOGLOBIN: 11.5 g/dL — AB (ref 13.0–17.7)
MCH: 27.6 pg (ref 26.6–33.0)
MCHC: 32.3 g/dL (ref 31.5–35.7)
MCV: 85 fL (ref 79–97)
Platelets: 192 10*3/uL (ref 150–379)
RBC: 4.17 x10E6/uL (ref 4.14–5.80)
RDW: 15.6 % — AB (ref 12.3–15.4)
WBC: 6.8 10*3/uL (ref 3.4–10.8)

## 2017-04-04 NOTE — Telephone Encounter (Signed)
Lab results given to pt per MD request. Recommendations given with verbal understanding.

## 2017-04-04 NOTE — Telephone Encounter (Signed)
Lab results given to pt per MD request. Also the recommendations given with verbal understanding

## 2017-05-03 ENCOUNTER — Encounter: Payer: Self-pay | Admitting: Family Medicine

## 2017-05-04 ENCOUNTER — Other Ambulatory Visit: Payer: Self-pay | Admitting: *Deleted

## 2017-05-04 DIAGNOSIS — K649 Unspecified hemorrhoids: Secondary | ICD-10-CM

## 2017-05-04 DIAGNOSIS — R58 Hemorrhage, not elsewhere classified: Secondary | ICD-10-CM

## 2017-05-14 IMAGING — CR DG HIP (WITH OR WITHOUT PELVIS) 2-3V*R*
4 series · 4 of 4 positions shown · non-contrast
Comparison: No recent prior.

CLINICAL DATA: Hip surgery.  Osteoarthritis.

EXAM:
DG HIP (WITH OR WITHOUT PELVIS) 2-3V RIGHT

[w pelvis ap *]
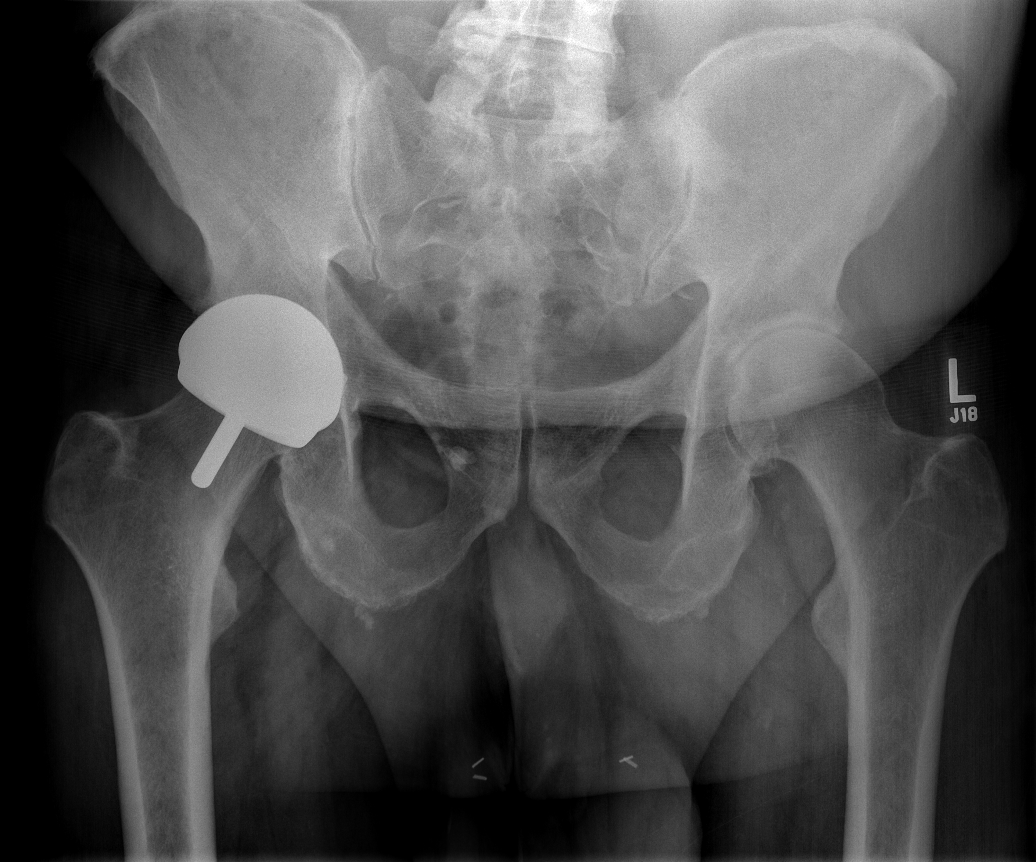

[t pelvis a.p.]
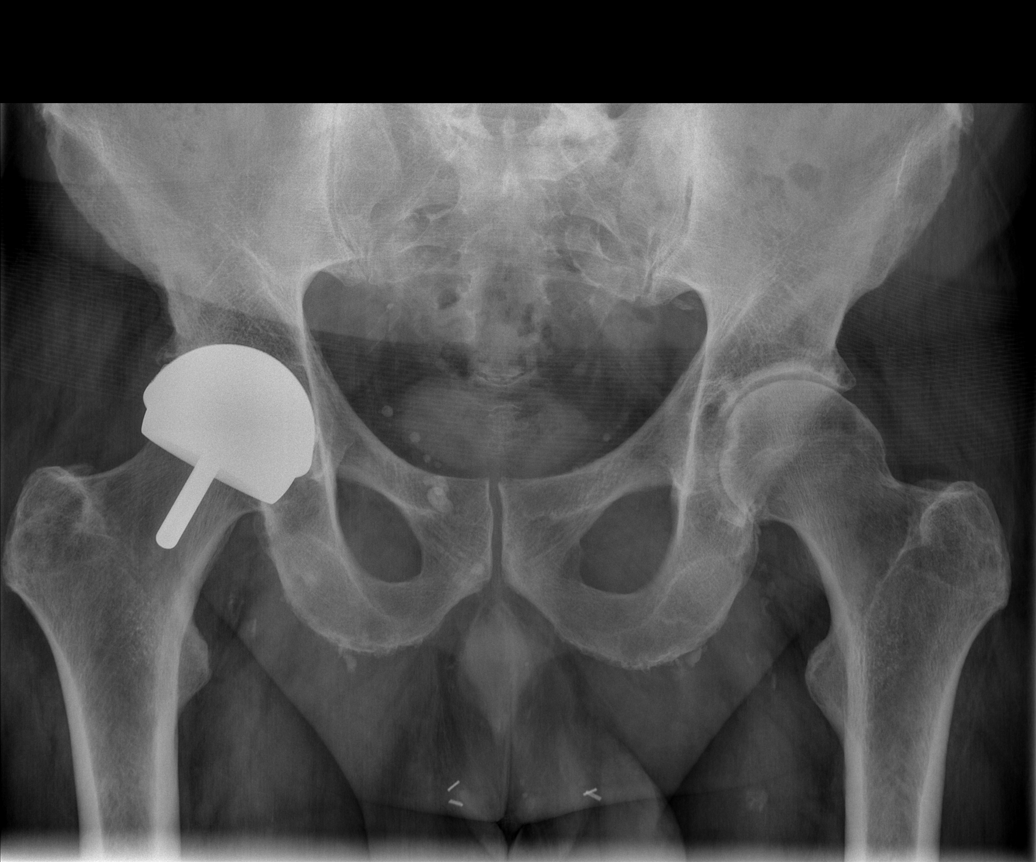

[t hip ap right (1 of 2)]
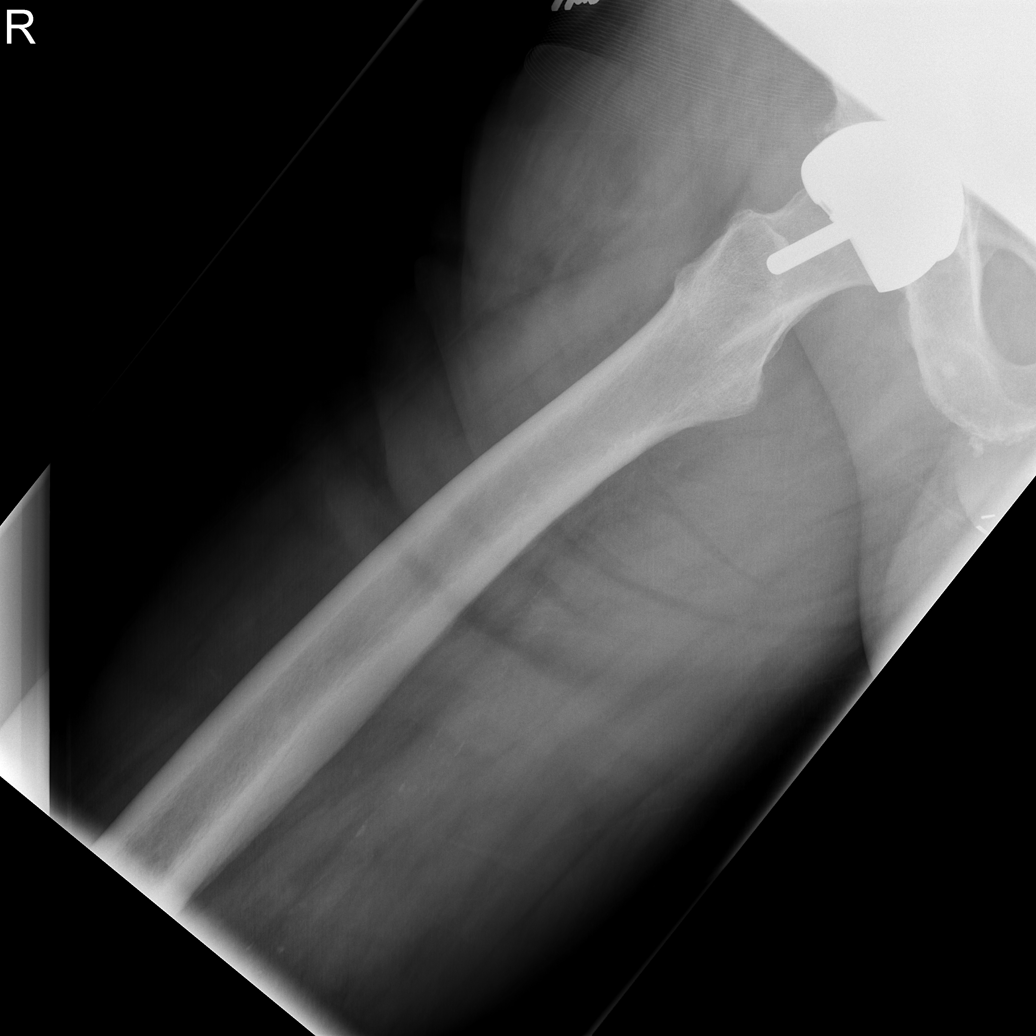

[t hip ap right (2 of 2)]
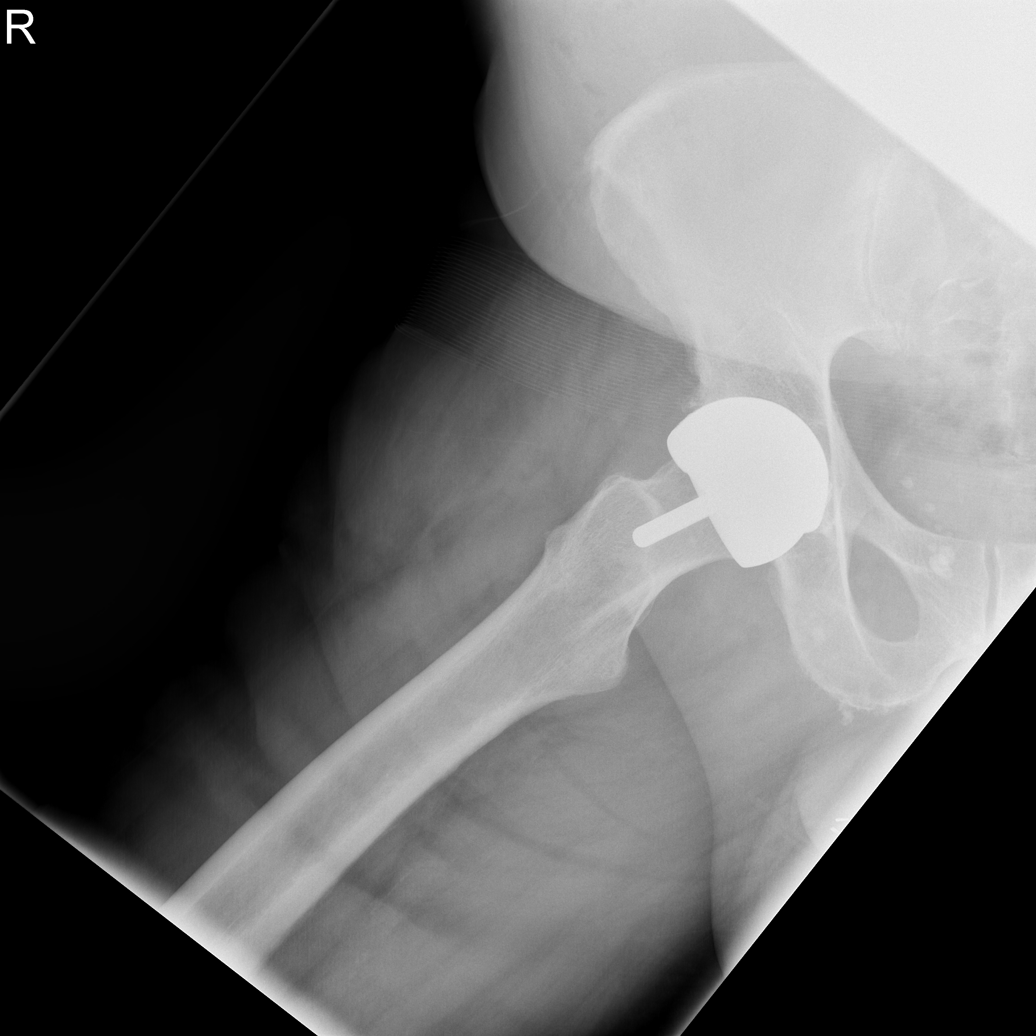

[4 of 4 positions shown; findings below may reference images not displayed]

FINDINGS: Right hip arthroplasty. Hardware intact. Good anatomic alignment.
Degenerative changes lumbar spine and left hip. No acute bony
abnormality. Pelvic calcifications consistent phleboliths. Surgical
clips noted over the upper portion of the lower extremities.
IMPRESSION: Postsurgical changes right hip. Hardware intact. Good anatomic
alignment. Degenerative changes lumbar spine left hip. No acute
abnormality.

## 2017-05-18 ENCOUNTER — Telehealth: Payer: Self-pay | Admitting: Family Medicine

## 2017-05-18 NOTE — Telephone Encounter (Signed)
Copied from CRM [phone]. Topic: Quick Communication - See Telephone Encounter >> May 18, 2017  2:36 PM Diana EvesHoyt, Maryann B wrote: CRM for notification. See Telephone encounter for:  05/18/17. Shannon,RN PEC nurse spoke with Joni ReiningNicole from [facility] Mikal Pl[facility]Brian Beacham [facility] and is requesting labs from 04/03/17 done by the cardiologist Clearance CootsPhilip Naser. to be faxed to the office. Fax # [phone] with Attn to GeringNicole. Joni Reiningicole can be contacted at [phone]

## 2017-05-19 NOTE — Telephone Encounter (Signed)
Lab results will need to come from Dr Carlena SaxNaser's office.  Fax request faxed and confirmed.

## 2017-05-30 ENCOUNTER — Other Ambulatory Visit: Payer: Self-pay

## 2017-05-30 ENCOUNTER — Other Ambulatory Visit: Payer: Self-pay | Admitting: *Deleted

## 2017-05-30 MED ORDER — METOPROLOL TARTRATE 25 MG PO TABS: 25.0000 mg | ORAL_TABLET | Freq: Two times a day (BID) | ORAL | 3 refills | Status: AC

## 2017-05-30 MED ORDER — ROSUVASTATIN CALCIUM 10 MG PO TABS: 10.0000 mg | ORAL_TABLET | Freq: Every day | ORAL | 0 refills | Status: DC

## 2017-05-30 MED ORDER — METOPROLOL TARTRATE 50 MG PO TABS: ORAL_TABLET | ORAL | 1 refills | Status: AC

## 2017-06-28 ENCOUNTER — Other Ambulatory Visit: Payer: Self-pay | Admitting: Family Medicine

## 2017-07-30 ENCOUNTER — Other Ambulatory Visit: Payer: Self-pay | Admitting: Cardiovascular Disease

## 2018-05-03 ENCOUNTER — Other Ambulatory Visit: Payer: Self-pay | Admitting: Cardiovascular Disease

## 2018-08-26 ENCOUNTER — Other Ambulatory Visit: Payer: Self-pay | Admitting: Cardiovascular Disease

## 2018-08-28 ENCOUNTER — Telehealth: Payer: Self-pay | Admitting: Nurse Practitioner

## 2018-08-28 NOTE — Telephone Encounter (Signed)
Left message on patient's voice mail requesting that he call back. Patient needs to have virtual ov in order to continue to refill medications and need updated lab work for patient on Eliquis.

## 2018-08-28 NOTE — Telephone Encounter (Signed)
Left message for patient to call back to schedule office visit .

## 2018-08-30 ENCOUNTER — Telehealth: Payer: Self-pay | Admitting: Cardiovascular Disease

## 2018-08-30 NOTE — Telephone Encounter (Signed)
Patient called back to report that [place] and has informed pharmacy that refills should not be sent to our office.

## 2018-08-30 NOTE — Telephone Encounter (Signed)
Noted, thank you

## 2018-08-30 NOTE — Telephone Encounter (Signed)
Follow up:    Patient to report that he no longer live in the state and he no loner see Dr. Elease Hashimoto. Patient states that he has told the pharmacy that he no longer sees Dr. Elease Hashimoto, they still continue contact the office about his medication. Please call patient back if there are anymore questions.

## 2019-06-12 ENCOUNTER — Encounter: Payer: Self-pay | Admitting: Cardiovascular Disease

## 2020-07-16 NOTE — Telephone Encounter (Signed)
Error
